# Patient Record
Sex: Female | Born: 1975 | Race: White | Hispanic: No | Marital: Married | Smoking: Never smoker
Health system: Southern US, Community
[De-identification: ages and names within clinical notes are randomized; demographics above are authoritative.]

## PROBLEM LIST (undated history)

## (undated) DIAGNOSIS — E079 Disorder of thyroid, unspecified: Secondary | ICD-10-CM

## (undated) DIAGNOSIS — R002 Palpitations: Principal | ICD-10-CM

## (undated) DIAGNOSIS — N809 Endometriosis, unspecified: Secondary | ICD-10-CM

## (undated) HISTORY — DX: Disorder of thyroid, unspecified: E07.9

## (undated) HISTORY — DX: Palpitations: R00.2

## (undated) HISTORY — DX: Endometriosis, unspecified: N80.9

## (undated) HISTORY — PX: APPENDECTOMY: SHX54

## (undated) HISTORY — PX: OTHER SURGICAL HISTORY: SHX169

---

## 2014-12-07 ENCOUNTER — Other Ambulatory Visit: Payer: Self-pay | Admitting: Obstetrics & Gynecology

## 2014-12-07 DIAGNOSIS — O200 Threatened abortion: Secondary | ICD-10-CM

## 2014-12-09 ENCOUNTER — Encounter: Payer: Self-pay | Admitting: Advanced Practice Midwife

## 2014-12-09 ENCOUNTER — Other Ambulatory Visit: Payer: Self-pay | Admitting: Obstetrics & Gynecology

## 2014-12-09 ENCOUNTER — Ambulatory Visit (INDEPENDENT_AMBULATORY_CARE_PROVIDER_SITE_OTHER): Payer: Self-pay

## 2014-12-09 ENCOUNTER — Ambulatory Visit (INDEPENDENT_AMBULATORY_CARE_PROVIDER_SITE_OTHER): Payer: Self-pay | Admitting: Advanced Practice Midwife

## 2014-12-09 VITALS — BP 120/72 | Ht 65.0 in | Wt 194.5 lb

## 2014-12-09 DIAGNOSIS — O3680X0 Pregnancy with inconclusive fetal viability, not applicable or unspecified: Secondary | ICD-10-CM

## 2014-12-09 DIAGNOSIS — O200 Threatened abortion: Secondary | ICD-10-CM

## 2014-12-09 NOTE — Progress Notes (Signed)
[facility] Clinic Visit  Patient name: Jane Benitez MRN [id number]  Date of birth: 05/28/1976  CC & HPI:  Jane Benitez is a 39 y.o. Caucasian female presenting today for ultrasound.  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  Denies pain, bleeding. Came to [facility] today for a F/U ultrasound/assess viability.   Pertinent History Reviewed:  Medical & Surgical Hx:   Past Medical History  Diagnosis Date  . Thyroid disease   . Endometriosis    Past Surgical History  Procedure Laterality Date  . Appendectomy    . Vaginal and anal repair    . Cesarean section      2012 and 2013   Family History  Problem Relation Age of Onset  . Hypertension Father   . Heart disease Maternal Grandfather   . Heart disease Paternal Grandfather     Current outpatient prescriptions:  .  levothyroxine (SYNTHROID, LEVOTHROID) 75 MCG tablet, Take 75 mcg by mouth daily before breakfast., Disp: , Rfl:  .  ondansetron (ZOFRAN) 4 MG tablet, Take 4 mg by mouth every 8 (eight) hours as needed. for nausea, Disp: , Rfl: 1 Social History: Reviewed -  reports that she has never smoked. She has never used smokeless tobacco.  Review of Systems:    Constitutional: Negative for fever, chills, weight loss, malaise/fatigue and diaphoresis.  HENT: Negative for hearing loss, ear pain, nosebleeds, congestion, sore throat, neck pain Eyes: Negative for blurred vision, double vision, photophobia, pain, discharge and redness.  Respiratory: Negative for cough, hemoptysis, sputum production, shortness of breath, wheezing.   Cardiovascular: Negative for chest pain, palpitations, leg swelling Gastrointestinal: Negative for heartburn, nausea, vomiting, diarrhea, constipation, blood in  stool Genitourinary: Negative for abdominal pain, dysuria, urgency, frequency, hematuria and flank pain.  Musculoskeletal: Negative for myalgias, back pain, joint pain and falls.  Skin: Negative for itching and rash.  Neurological: Negative for dizziness, tingling, tremors, sensory change, speech change,  weakness and headaches.     Objective Findings:  Vitals: BP 120/72 mmHg  Ht  (1.651 m)  Wt 88.225 kg (194 lb 8 oz)  BMI 32.37 kg/m2  LMP 09/13/2014 (Exact Date)  Physical Examination:  General appearance - alert, well appearing, and in no distress Mental status - normal mood, behavior, speech, dress, motor activity, and thought processes Neurological - alert, oriented, normal speech, no focal findings or movement disorder noted Musculoskeletal - no muscular tenderness noted Extremities - no pedal edema noted Skin - normal coloration and turgor, no rashes, no suspicious skin lesions noted  Ultrasound today:  Retroverted uterus with heterogeneous, mixed, cystic area within endometrium measuring 6.1 x 5.3 x 3.5cm. Concerning for potential molar pregnancy. No definitive gestational sac visualized. Bilateral ovaries are within normal limits.      Assessment & Plan:  A:   Possible molar pregnancy vs nonviable pregnancy  P:  Qhcg today.  Discussed with Dr. Despina Hidden.  Dr. Despina Hidden counseled pt regarding warning signs (hemorrhage, pain) that would warrant immediate f/u.  Otherwise, if a molar pregnancy is diagnosed, it is reasonable to postpone D&C until 3/1 when pt will have health insurance .    Request records from [place] to verify blood type (pt is B neg but  has an allel that shows as +rh; pt will need rhogam if bleeding or at time of D&C or SAB).    50% or more of this visit was spent in counseling and coordination of care.  30 minutes total encounter time    CRESENZO-DISHMAN,Ysmael Hires CNM 12/09/2014 10:42 PM

## 2014-12-09 NOTE — Patient Instructions (Signed)
Molar Pregnancy A molar pregnancy (hydatidiform mole) is a mass of tissue that grows in the uterus after conception. The mass is created by an egg that was not fertilized correctly and abnormally grows. It is an abnormal pregnancy and does not develop into a fetus. If a molar pregnancy is suspected by your health care provider, treatment is required. CAUSES  Molar pregnancy is caused by an egg that is fertilized incorrectly so that it has abnormal genetic material (chromosomes). This can result in one of 2 types of molar pregnancy:  Complete molar pregnancy--All of the chromosomes in the fertilized egg come from the father; none come from the mother.  Partial molar pregnancy--The fertilized egg has chromosomes from the father and mother, but it has too many chromosomes. RISK FACTORS  Certain risk factors make a molar pregnancy more likely. They include:   Being over age 37 or under age 37.  History of a molar pregnancy in the past (extremely small chance of recurrence). Other possible risk factors include:   Smoking more than 15 cigarettes per day.  History of infertility.  Having a certain blood type (A, B, AB).  Having a vitamin A deficiency.  Currently using oral contraceptives. SIGNS AND SYMPTOMS   Vaginal bleeding.  Missed menstrual period.  Uterus grows quicker than normal.  Severe nausea and vomiting.  Severe pressure or pain in the uterus.  Abnormal ovarian cysts (theca lutein cysts).  Discharge from the vagina that looks like grapes.  High blood pressure (early onset of preeclampsia).  Overactive thyroid (hyperthyroidism).  Anemia. DIAGNOSIS  If your health care provider thinks there is a chance of a molar pregnancy, testing will be recommended. Possible tests include:   An ultrasound test.  Blood tests. TREATMENT  Most molar pregnancies end on their own by miscarriage. However, a health care provider needs to make sure that all the abnormal tissue is out  of the womb. This can be done with dilation and curettage (D&C) or suction curettage. In this procedure, any remaining molar tissue is removed through the vagina. After diagnosis of a molar pregnancy, the pregnancy hormone levels must be followed until the level is zero. If the pregnancy hormone level does not drop appropriately, chemotherapy may be necessary. Also, you will be given a medicine called Rho (D) immune globulin if you are Rh negative and your sex partner is Rh positive. This helps prevent Rh problems in future pregnancies. HOME CARE INSTRUCTIONS   Avoid getting pregnant for 6-12 months or as directed by your health care provider. Use a reliable form of birth control or do not have sex.  Only take over-the-counter or prescription medicine as directed by your health care provider.  Keep all follow-up appointments and get all suggested lab tests and ultrasound tests.  Gradually return to normal activities.  Think about joining a support group. Ask for help if you are struggling with grief. Document Released: 07/03/2011 Document Revised: 03/01/2014 Document Reviewed: 05/14/2013 [facility] Patient Information 2015 [place], [place]. This information is not intended to replace advice given to you by your health care provider. Make sure you discuss any questions you have with your health care provider.

## 2014-12-09 NOTE — Progress Notes (Signed)
Dating US completed today. Retroverted uterus with heterogeneous, mixed, cystic area within endometrium measuring 6.1 x 5.3 x 3.5cm.  Concerning for potential molar pregnancy. No definitive gestational sac visualized.   Bilateral ovaries are within normal limits.

## 2014-12-10 ENCOUNTER — Telehealth: Payer: Self-pay | Admitting: Adult Health

## 2014-12-10 LAB — HCG, QUANTITATIVE, PREGNANCY: HCG QUANT: 36361 m[IU]/mL

## 2014-12-10 LAB — TSH: TSH: 1.87 u[IU]/mL (ref 0.450–4.500)

## 2014-12-10 NOTE — Telephone Encounter (Signed)
Pt informed of QHCG [id number] and TSH 1.870 results from 12/09/2014.

## 2014-12-14 ENCOUNTER — Telehealth: Payer: Self-pay | Admitting: Obstetrics & Gynecology

## 2014-12-14 NOTE — Telephone Encounter (Signed)
Pt states saw Drenda FreezeFran on 12/09/2014 questionable molar pregnancy. Pt states passed tissue this past Saturday, light cramping and notices clots when she voids but states she is not having a lot of bleeding. She is wearing 1 pad for all day. Pt [facility] Please advise.

## 2014-12-16 ENCOUNTER — Telehealth: Payer: Self-pay | Admitting: Obstetrics & Gynecology

## 2014-12-16 MED ORDER — MISOPROSTOL 200 MCG PO TABS: ORAL_TABLET | ORAL | Status: DC

## 2014-12-16 NOTE — Telephone Encounter (Signed)
I talked with pt by phone  Passed large mount of bloody tissue she feels is the gestational sac  Some episodic pain and bleeding now  Will try some cytotec over the next 2 days and see response  HCG 36000+  Pt given call back instructions

## 2015-07-18 LAB — OB RESULTS CONSOLE RUBELLA ANTIBODY, IGM: RUBELLA: IMMUNE

## 2015-07-18 LAB — OB RESULTS CONSOLE HIV ANTIBODY (ROUTINE TESTING): HIV: NONREACTIVE

## 2015-07-18 LAB — OB RESULTS CONSOLE RPR: RPR: NONREACTIVE

## 2015-07-18 LAB — OB RESULTS CONSOLE PLATELET COUNT: Platelets: 257 10*3/uL

## 2015-07-18 LAB — OB RESULTS CONSOLE HGB/HCT, BLOOD
HCT: 38 %
Hemoglobin: 12.6 g/dL

## 2015-07-18 LAB — OB RESULTS CONSOLE HEPATITIS B SURFACE ANTIGEN: Hepatitis B Surface Ag: NEGATIVE

## 2015-10-14 ENCOUNTER — Encounter (HOSPITAL_COMMUNITY): Payer: Self-pay | Admitting: *Deleted

## 2015-11-04 LAB — OB RESULTS CONSOLE HGB/HCT, BLOOD
HCT: 34 %
Hemoglobin: 11.6 g/dL

## 2015-11-22 ENCOUNTER — Telehealth: Payer: Self-pay | Admitting: Cardiovascular Disease

## 2015-11-22 NOTE — Telephone Encounter (Signed)
Received records from [facility] for appointment on 12/19/15 with Dr Duke Salvia.  Records given to [facility] (medical records) for Dr Leonides Sake schedule on 12/19/15. lp

## 2015-11-23 ENCOUNTER — Other Ambulatory Visit: Payer: Self-pay | Admitting: Obstetrics and Gynecology

## 2015-12-07 ENCOUNTER — Encounter (HOSPITAL_COMMUNITY): Payer: Self-pay | Admitting: Obstetrics and Gynecology

## 2015-12-16 ENCOUNTER — Encounter (HOSPITAL_COMMUNITY): Payer: Self-pay

## 2015-12-16 ENCOUNTER — Ambulatory Visit (HOSPITAL_COMMUNITY)
Admission: RE | Admit: 2015-12-16 | Discharge: 2015-12-16 | Disposition: A | Discharge status: Home / Self Care | Payer: BLUE CROSS/BLUE SHIELD | Source: Ambulatory Visit | Attending: Obstetrics and Gynecology | Admitting: Obstetrics and Gynecology

## 2015-12-16 DIAGNOSIS — O34219 Maternal care for unspecified type scar from previous cesarean delivery: Secondary | ICD-10-CM | POA: Insufficient documentation

## 2015-12-16 DIAGNOSIS — Z3A32 32 weeks gestation of pregnancy: Secondary | ICD-10-CM | POA: Insufficient documentation

## 2015-12-16 NOTE — Consult Note (Signed)
Maternal Fetal Medicine Consultation  Requesting Provider(s): Waynard Reeds, MD  Reason for consultation: suspected uterine scar defect - timing of delivery  HPI: Jane Benitez is a 40 yo G5P2102, EDD 32w 4d seen for consultation and recommendations for timing of delivery due to a suspected uterine scar abnormality.  Jane Benitez past OB history is as follows: G1 - 2009 - preterm contractions, SROM, SVD at 39 weeks complicated by 4th degree laceration.  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  G2 - C-section at 36 6/7 weeks - active labor.  No complications  G3 - Repeat C-section 37 weeks (presented in active labor).  Operative notes for both C-sections not available.  G4 - SAB at 14 weeks.  Concerns for uterine scar defect noted on prenatal ultrasound.  Did not require D&C.  Due to concerns of a uterine scar defect on prenatal ultrasound, a sonohystogram was performed in the office.  A uterine scar defect was noted - 2.3 mm (while not pregnant).  Jane Benitez was seen today for recommendations for timing of delivery given concerns for a uterine scar defect by ultrasound. Her primary Obstetrician feels very strongly that the C-section should be scheduled prior to 39 weeks. The patient's biggest concern is actually that she will go into preterm labor prior to 39 weeks and wants to avoid a vaginal delivery at all costs given the extensive pelvic surgery repairs that she has had in the past.  OB History: OB History    Gravida Para Term Preterm AB TAB SAB Ectopic Multiple Living   2               PMH:  Past Medical History  Diagnosis Date  . Thyroid disease   . Endometriosis     PSH:  Past Surgical History  Procedure Laterality Date  . Appendectomy    . Vaginal and anal repair    . Cesarean section      2012 and 2013   Meds:  Current Outpatient Prescriptions on File Prior to Encounter  Medication Sig Dispense Refill  . levothyroxine (SYNTHROID,  LEVOTHROID) 75 MCG tablet Take 75 mcg by mouth daily before breakfast.    . misoprostol (CYTOTEC) 200 MCG tablet 1 tablet 4 times a day for 2 days 8 tablet 0  . ondansetron (ZOFRAN) 4 MG tablet Take 4 mg by mouth every 8 (eight) hours as needed. for nausea  1   No current facility-administered medications on file prior to encounter.   Allergies: No Known Allergies   FH:  Family History  Problem Relation Age of Onset  . Hypertension Father   . Heart disease Maternal Grandfather   . Heart disease Paternal Grandfather    Soc:  Social History   Social History  . Marital Status: Married    Spouse Name: N/A  . Number of Children: N/A  . Years of Education: N/A   Occupational History  . Not on file.   Social History Main Topics  . Smoking status: Never Smoker   . Smokeless tobacco: Never Used  . Alcohol Use: No  . Drug Use: No  . Sexual Activity: Yes   Other Topics Concern  . Not on file   Social History Narrative    Review of Systems: no vaginal bleeding or cramping/contractions, no LOF, no nausea/vomiting. All other systems reviewed and are negative.  PE:   Filed Vitals:   12/16/15 1113  BP: 134/71  Pulse: 89     A/P: 1)  Single IUP at 32w 4d  2) Hx of previous 4th degree laceration, RV fistula requiring repair x 2  3) Hx of C-section x 2 with concerns for uterine scar abnormality - the patient's previous C-sections were both performed in [place] and operative notes are not available for review.  A sonohystogram was performed prior to pregnancy that revealed a uterine scar defect - measuring 2.3 mm.  There is very little guidance regarding the appropriate timing for repeat C-section in women with suspected uterine scar defects.  Based on a quick literature review, a lower uterine segment thickness of < 3.65 mm has been used in counseling patient for VBAC (less than that value should not labor).  At least one study from 2009 reported that a full thickness LUG of < 2.3 mm was  associated with a higher risk of uterine rupture.  Both of these studies used measurements performed during the 3rd trimester, but if this degree of thinning was noted on a pre-pregnancy study, I would expect the thinning to be even more significant now.  I cannot predict the risk of uterine rupture should the patient labor or if her repeat C-section is scheduled at 39 weeks as opposed to 37 weeks.  She is clearly not a candidate for a trial of labor.  Her primary Obstetrician feels very strongly that delivery at 37 weeks is indicated. Unfortunately, there are no published guidelines to assist in  the timing of delivery, but would have to assume that the risk of uterine rupture would approach that of a prior vertical uterine incision.  In my opinion, feel that a scheduled C-section is appropriate at 37 weeks.  Would offer a course of late preterm betamethasone at ~36 weeks.  I would not perform amniocentesis to verify fetal lung maturity as the C-section is will be performed for maternal indications.   Thank you for the opportunity to be a part of the care of Jane Benitez. Please contact our office if we can be of further assistance.   I spent approximately 30 minutes with this patient with over 50% of time spent in face-to-face counseling.  Alpha Gula, MD Maternal Fetal Medicine

## 2015-12-18 NOTE — Progress Notes (Signed)
Cardiology Office Note   Date:  12/19/2015   ID:  Jane Benitez, DOB 1976-07-08, MRN [id number]  PCP:  No primary care provider on file.  Cardiologist:   Madilyn Hook, MD   Chief Complaint  Patient presents with  . New Evaluation    feels like heart is skipping a beat and having tachycardia    History of Present Illness: Jane Benitez is a 40 y.o. pregnant female with GERD who presents for an evaluation of palpitations.  Jane Benitez is [redacted] weeks pregnant.  During this pregnancy she has noted episodes of both intermittent and longer lasting palpitations.  She describes the intermittent palpitations as heart fluttering that lasts for 2-3 seconds at a time.  It is not associated with lightheadedness, dizziness, chest pain or shortness of breath.  It occurs sporadically both at rest and with exertion and happens most days.  She also reports heart racing that last for 2-3 minutes at a time.  She counted her heart rate and it was 150 bpm.  This was associated with nausea and a feeling of fatigue.  This occurs randomly and ranges from once every 1-2 weeks up to 2-3 times in one week.  She never had these symptoms before becoming pregnant or with her 5 previous pregnancies.  She has not noted any more lower extremity edema or shortness of breath with this pregnancy than with any of her previous pregnancies.  She tried limiting caffeine intake, but this did not change her symptoms.    Jane Benitez reported these symptoms to her OB/Gyn, Dr. Waynard Reeds.  Her thyroid function was within normal limits and she was mildly anemic.  She was referred to cardiology for further evaluation.  Past Medical History  Diagnosis Date  . Thyroid disease   . Endometriosis   . Palpitations 12/19/2015    Past Surgical History  Procedure Laterality Date  . Appendectomy    . Vaginal and anal repair    . Cesarean section      2012 and 2013     Current Outpatient Prescriptions  Medication Sig  Dispense Refill  . lansoprazole (PREVACID) 30 MG capsule Take 30 mg by mouth as needed.    Marland Kitchen levothyroxine (SYNTHROID, LEVOTHROID) 75 MCG tablet Take 75 mcg by mouth daily before breakfast.    . Prenatal Vit w/Fe-Methylfol-FA (PNV PO) Take by mouth.     No current facility-administered medications for this visit.    Allergies:   Review of patient's allergies indicates no known allergies.    Social History:  The patient  reports that she has never smoked. She has never used smokeless tobacco. She reports that she does not drink alcohol or use illicit drugs.   Family History:  The patient's family history includes Heart disease in her maternal grandfather and paternal grandfather; Hypertension in her father, maternal grandfather, and paternal grandfather.  Dad, PGF, MGF in 23s, 70s  ROS:  Please see the history of present illness.   Otherwise, review of systems are positive for none.   All other systems are reviewed and negative.    PHYSICAL EXAM: VS:  BP 130/84 mmHg  Pulse 72  Ht  (1.651 m)  Wt 95.255 kg (210 lb)  BMI 34.95 kg/m2  LMP 05/02/2015 , BMI Body mass index is 34.95 kg/(m^2). GENERAL:  Well appearing HEENT:  Pupils equal round and reactive, fundi not visualized, oral mucosa unremarkable NECK:  No jugular venous distention, waveform within normal limits, carotid upstroke brisk and  symmetric, no bruits, no thyromegaly LYMPHATICS:  No cervical adenopathy LUNGS:  Clear to auscultation bilaterally HEART:  RRR.  PMI not displaced or sustained,S1 and S2 within normal limits, no S3, no S4, no clicks, no rubs, II/VI murmur heard throughout. ABD:  Flat, positive bowel sounds normal in frequency in pitch, no bruits, no rebound, no guarding, no midline pulsatile mass, no hepatomegaly, no splenomegaly EXT:  2 plus pulses throughout, no edema, no cyanosis no clubbing SKIN:  No rashes no nodules NEURO:  Cranial nerves II through XII grossly intact, motor grossly intact  throughout PSYCH:  Cognitively intact, oriented to person place and time   EKG:  EKG is ordered today. The ekg ordered today demonstrates sinus rhythm rate 82 bpm.     Recent Labs: No results found for requested labs within last 365 days.    Lipid Panel No results found for: CHOL, TRIG, HDL, CHOLHDL, VLDL, LDLCALC, LDLDIRECT    Wt Readings from Last 3 Encounters:  12/19/15 95.255 kg (210 lb)  12/16/15 96.163 kg (212 lb)  12/09/14 88.225 kg (194 lb 8 oz)    11/04/15: TSH 1.81 WBC 10.5, Hgb 11.6, Hct 33.9, Plt 258   ASSESSMENT AND PLAN:  # Palpitations: I'm suspicious that she is having PACs or PVCs that are causing her intermittent palpitations.  However, the longer episodes sound as though she is having SVT.  This can sometimes be exacerbated by pregnancy.  We will check a BMP and magnesium.  We will also get a 14 day Event monitor to associate the symptoms with the underlying rhythm.  We will not start any medications at this time.  She does not seem to be very bothered by the symptoms and would like to avoid medications if possible.  We will follow up after pregnancy to see if her symptoms have resolved.   Current medicines are reviewed at length with the patient today.  The patient does not have concerns regarding medicines.  The following changes have been made:  no change  Labs/ tests ordered today include:   Orders Placed This Encounter  Procedures  . Basic metabolic panel  . Magnesium  . Cardiac event monitor  . EKG 12-Lead     Disposition:   FU with Moneka Mcquinn C. Duke Salvia, MD, [facility] in 3 months.    This note was written with the assistance of speech recognition software.  Please excuse any transcriptional errors.  Signed, Avonda Toso C. Duke Salvia, MD, [facility]  12/19/2015 12:55 PM    [facility]

## 2015-12-19 ENCOUNTER — Encounter: Payer: Self-pay | Admitting: Cardiovascular Disease

## 2015-12-19 ENCOUNTER — Ambulatory Visit (INDEPENDENT_AMBULATORY_CARE_PROVIDER_SITE_OTHER): Payer: BLUE CROSS/BLUE SHIELD | Admitting: Cardiovascular Disease

## 2015-12-19 VITALS — BP 130/84 | HR 72 | Ht 65.0 in | Wt 210.0 lb

## 2015-12-19 DIAGNOSIS — R002 Palpitations: Secondary | ICD-10-CM

## 2015-12-19 HISTORY — DX: Palpitations: R00.2

## 2015-12-19 NOTE — Patient Instructions (Signed)
PLEASE HAVE LAB DONE --BMP ,MAGNESIUM  Your physician has recommended that you wear an event monitor-14 DAY FOR PALPITATIONS -WILL BE PLACED AT [address]. Event monitors are medical devices that record the heart's electrical activity. Doctors most often [place] these monitors to diagnose arrhythmias. Arrhythmias are problems with the speed or rhythm of the heartbeat. The monitor is a small, portable device. You can wear one while you do your normal daily activities. This is usually used to diagnose what is causing palpitations/syncope (passing out).   WILL CALL YOU WITH RESULTS.   Your physician recommends that you schedule a follow-up appointment in 3 MONTHS WITH DR [facility].

## 2015-12-29 ENCOUNTER — Other Ambulatory Visit: Payer: Self-pay | Admitting: Obstetrics and Gynecology

## 2015-12-30 ENCOUNTER — Inpatient Hospital Stay (HOSPITAL_COMMUNITY): Payer: BLUE CROSS/BLUE SHIELD

## 2015-12-30 ENCOUNTER — Encounter (HOSPITAL_COMMUNITY): Payer: Self-pay

## 2015-12-30 ENCOUNTER — Inpatient Hospital Stay (HOSPITAL_COMMUNITY)
Admission: AD | Admit: 2015-12-30 | Discharge: 2016-01-03 | DRG: 765 | Disposition: A | Discharge status: Home / Self Care | Payer: BLUE CROSS/BLUE SHIELD | Source: Ambulatory Visit | Attending: Obstetrics | Admitting: Obstetrics

## 2015-12-30 ENCOUNTER — Observation Stay (HOSPITAL_COMMUNITY): Payer: BLUE CROSS/BLUE SHIELD | Admitting: Anesthesiology

## 2015-12-30 ENCOUNTER — Encounter (HOSPITAL_COMMUNITY)
Admission: AD | Disposition: A | Discharge status: Home / Self Care | Payer: Self-pay | Source: Ambulatory Visit | Attending: Obstetrics

## 2015-12-30 DIAGNOSIS — O368390 Maternal care for abnormalities of the fetal heart rate or rhythm, unspecified trimester, not applicable or unspecified: Secondary | ICD-10-CM

## 2015-12-30 DIAGNOSIS — Z1389 Encounter for screening for other disorder: Secondary | ICD-10-CM

## 2015-12-30 DIAGNOSIS — O34211 Maternal care for low transverse scar from previous cesarean delivery: Principal | ICD-10-CM | POA: Diagnosis present

## 2015-12-30 DIAGNOSIS — Z363 Encounter for antenatal screening for malformations: Secondary | ICD-10-CM

## 2015-12-30 DIAGNOSIS — Z302 Encounter for sterilization: Secondary | ICD-10-CM

## 2015-12-30 DIAGNOSIS — Z98891 History of uterine scar from previous surgery: Secondary | ICD-10-CM

## 2015-12-30 DIAGNOSIS — Z3A34 34 weeks gestation of pregnancy: Secondary | ICD-10-CM | POA: Diagnosis not present

## 2015-12-30 DIAGNOSIS — O4700 False labor before 37 completed weeks of gestation, unspecified trimester: Secondary | ICD-10-CM | POA: Diagnosis present

## 2015-12-30 DIAGNOSIS — IMO0002 Reserved for concepts with insufficient information to code with codable children: Secondary | ICD-10-CM

## 2015-12-30 LAB — URINE MICROSCOPIC-ADD ON

## 2015-12-30 LAB — TYPE AND SCREEN
ABO/RH(D): B NEG
Antibody Screen: NEGATIVE

## 2015-12-30 LAB — URINALYSIS, ROUTINE W REFLEX MICROSCOPIC
BILIRUBIN URINE: NEGATIVE
Glucose, UA: NEGATIVE mg/dL
Ketones, ur: NEGATIVE mg/dL
LEUKOCYTES UA: NEGATIVE
NITRITE: NEGATIVE
Protein, ur: NEGATIVE mg/dL
pH: 7 (ref 5.0–8.0)

## 2015-12-30 LAB — CBC
HCT: 31.7 % — ABNORMAL LOW (ref 36.0–46.0)
Hemoglobin: 10.8 g/dL — ABNORMAL LOW (ref 12.0–15.0)
MCH: 28.7 pg (ref 26.0–34.0)
MCHC: 34.1 g/dL (ref 30.0–36.0)
MCV: 84.3 fL (ref 78.0–100.0)
PLATELETS: 218 10*3/uL (ref 150–400)
RBC: 3.76 MIL/uL — AB (ref 3.87–5.11)
RDW: 12.8 % (ref 11.5–15.5)
WBC: 13.9 10*3/uL — AB (ref 4.0–10.5)

## 2015-12-30 LAB — ABO/RH: ABO/RH(D): B NEG

## 2015-12-30 LAB — GROUP B STREP BY PCR: GROUP B STREP BY PCR: NEGATIVE

## 2015-12-30 SURGERY — Surgical Case
Anesthesia: Spinal

## 2015-12-30 MED ORDER — NALBUPHINE HCL 10 MG/ML IJ SOLN: 5.0000 mg | INTRAMUSCULAR | Status: DC | PRN

## 2015-12-30 MED ORDER — DIPHENHYDRAMINE HCL 25 MG PO CAPS
25.0000 mg | ORAL_CAPSULE | Freq: Four times a day (QID) | ORAL | Status: DC | PRN
Start: 2015-12-30 — End: 2016-01-03
  Filled 2015-12-30 (×2): qty 1

## 2015-12-30 MED ORDER — ACETAMINOPHEN 325 MG PO TABS: 650.0000 mg | ORAL_TABLET | ORAL | Status: DC | PRN

## 2015-12-30 MED ORDER — ONDANSETRON HCL 4 MG/2ML IJ SOLN: 4.0000 mg | Freq: Three times a day (TID) | INTRAMUSCULAR | Status: DC | PRN

## 2015-12-30 MED ORDER — MORPHINE SULFATE (PF) 0.5 MG/ML IJ SOLN
INTRAMUSCULAR | Status: AC
  Filled 2015-12-30: qty 10

## 2015-12-30 MED ORDER — NALOXONE HCL 0.4 MG/ML IJ SOLN: 0.4000 mg | INTRAMUSCULAR | Status: DC | PRN

## 2015-12-30 MED ORDER — DIBUCAINE 1 % RE OINT: 1.0000 "application " | TOPICAL_OINTMENT | RECTAL | Status: DC | PRN

## 2015-12-30 MED ORDER — BUPIVACAINE IN DEXTROSE 0.75-8.25 % IT SOLN
INTRATHECAL | Status: DC | PRN
  Administered 2015-12-30: 1.3 mL via INTRATHECAL

## 2015-12-30 MED ORDER — SCOPOLAMINE 1 MG/3DAYS TD PT72: 1.0000 | MEDICATED_PATCH | Freq: Once | TRANSDERMAL | Status: DC

## 2015-12-30 MED ORDER — CEFAZOLIN SODIUM-DEXTROSE 2-3 GM-% IV SOLR
2.0000 g | Freq: Once | INTRAVENOUS | Status: AC
  Administered 2015-12-30: 2 g via INTRAVENOUS
  Filled 2015-12-30: qty 50

## 2015-12-30 MED ORDER — PRENATAL MULTIVITAMIN CH
1.0000 | ORAL_TABLET | Freq: Every day | ORAL | Status: DC
  Administered 2015-12-31 – 2016-01-02 (×3): 1 via ORAL
  Filled 2015-12-30 (×3): qty 1

## 2015-12-30 MED ORDER — SCOPOLAMINE 1 MG/3DAYS TD PT72
MEDICATED_PATCH | TRANSDERMAL | Status: DC | PRN
  Administered 2015-12-30: 1 via TRANSDERMAL

## 2015-12-30 MED ORDER — TETANUS-DIPHTH-ACELL PERTUSSIS 5-2.5-18.5 LF-MCG/0.5 IM SUSP: 0.5000 mL | Freq: Once | INTRAMUSCULAR | Status: DC

## 2015-12-30 MED ORDER — PHENYLEPHRINE 8 MG IN D5W 100 ML (0.08MG/ML) PREMIX OPTIME
INJECTION | INTRAVENOUS | Status: AC
  Filled 2015-12-30: qty 100

## 2015-12-30 MED ORDER — NALOXONE HCL 2 MG/2ML IJ SOSY: 1.0000 ug/kg/h | PREFILLED_SYRINGE | INTRAVENOUS | Status: DC | PRN

## 2015-12-30 MED ORDER — CEFAZOLIN (ANCEF) 1 G IV SOLR: 1.0000 g | INTRAVENOUS | Status: DC

## 2015-12-30 MED ORDER — FENTANYL CITRATE (PF) 100 MCG/2ML IJ SOLN
INTRAMUSCULAR | Status: AC
  Filled 2015-12-30: qty 2

## 2015-12-30 MED ORDER — LACTATED RINGERS IV SOLN
INTRAVENOUS | Status: DC | PRN
  Administered 2015-12-30 (×3): via INTRAVENOUS

## 2015-12-30 MED ORDER — PRENATAL MULTIVITAMIN CH
1.0000 | ORAL_TABLET | Freq: Every day | ORAL | Status: DC
  Filled 2015-12-30 (×2): qty 1

## 2015-12-30 MED ORDER — SODIUM CHLORIDE 0.9 % IR SOLN
Status: DC | PRN
  Administered 2015-12-30: 1

## 2015-12-30 MED ORDER — SIMETHICONE 80 MG PO CHEW: 80.0000 mg | CHEWABLE_TABLET | ORAL | Status: DC | PRN

## 2015-12-30 MED ORDER — LEVOTHYROXINE SODIUM 75 MCG PO TABS
75.0000 ug | ORAL_TABLET | Freq: Every day | ORAL | Status: DC
  Administered 2015-12-31 – 2016-01-03 (×4): 75 ug via ORAL
  Filled 2015-12-30 (×4): qty 1

## 2015-12-30 MED ORDER — MEPERIDINE HCL 25 MG/ML IJ SOLN: 6.2500 mg | INTRAMUSCULAR | Status: DC | PRN

## 2015-12-30 MED ORDER — SODIUM CHLORIDE 0.9% FLUSH: 3.0000 mL | INTRAVENOUS | Status: DC | PRN

## 2015-12-30 MED ORDER — KETOROLAC TROMETHAMINE 30 MG/ML IJ SOLN: 30.0000 mg | Freq: Four times a day (QID) | INTRAMUSCULAR | Status: AC | PRN

## 2015-12-30 MED ORDER — CALCIUM CARBONATE ANTACID 500 MG PO CHEW
2.0000 | CHEWABLE_TABLET | ORAL | Status: DC | PRN
  Filled 2015-12-30: qty 2

## 2015-12-30 MED ORDER — DOCUSATE SODIUM 100 MG PO CAPS
100.0000 mg | ORAL_CAPSULE | Freq: Every day | ORAL | Status: DC
  Filled 2015-12-30 (×2): qty 1

## 2015-12-30 MED ORDER — OXYCODONE HCL 5 MG PO TABS
10.0000 mg | ORAL_TABLET | ORAL | Status: DC | PRN
  Administered 2016-01-01 (×2): 10 mg via ORAL
  Filled 2015-12-30 (×2): qty 2

## 2015-12-30 MED ORDER — LACTATED RINGERS IV SOLN: INTRAVENOUS | Status: DC

## 2015-12-30 MED ORDER — OXYTOCIN 10 UNIT/ML IJ SOLN
INTRAMUSCULAR | Status: AC
  Filled 2015-12-30: qty 4

## 2015-12-30 MED ORDER — MORPHINE SULFATE (PF) 4 MG/ML IV SOLN
8.0000 mg | Freq: Once | INTRAVENOUS | Status: AC
  Administered 2015-12-30: 8 mg via INTRAMUSCULAR
  Filled 2015-12-30: qty 2

## 2015-12-30 MED ORDER — ONDANSETRON HCL 4 MG/2ML IJ SOLN
INTRAMUSCULAR | Status: DC | PRN
  Administered 2015-12-30: 4 mg via INTRAVENOUS

## 2015-12-30 MED ORDER — KETOROLAC TROMETHAMINE 30 MG/ML IJ SOLN
30.0000 mg | Freq: Four times a day (QID) | INTRAMUSCULAR | Status: AC | PRN
  Administered 2015-12-30: 30 mg via INTRAMUSCULAR

## 2015-12-30 MED ORDER — DIPHENHYDRAMINE HCL 25 MG PO CAPS
25.0000 mg | ORAL_CAPSULE | ORAL | Status: DC | PRN
  Administered 2015-12-30 – 2015-12-31 (×2): 25 mg via ORAL
  Filled 2015-12-30: qty 1

## 2015-12-30 MED ORDER — SCOPOLAMINE 1 MG/3DAYS TD PT72
MEDICATED_PATCH | TRANSDERMAL | Status: AC
  Filled 2015-12-30: qty 1

## 2015-12-30 MED ORDER — DEXAMETHASONE SODIUM PHOSPHATE 4 MG/ML IJ SOLN
INTRAMUSCULAR | Status: AC
  Filled 2015-12-30: qty 1

## 2015-12-30 MED ORDER — SIMETHICONE 80 MG PO CHEW
80.0000 mg | CHEWABLE_TABLET | ORAL | Status: DC
  Administered 2015-12-30 – 2016-01-02 (×4): 80 mg via ORAL
  Filled 2015-12-30 (×4): qty 1

## 2015-12-30 MED ORDER — OXYTOCIN 10 UNIT/ML IJ SOLN
40.0000 [IU] | INTRAVENOUS | Status: DC | PRN
  Administered 2015-12-30: 40 [IU] via INTRAVENOUS

## 2015-12-30 MED ORDER — CITRIC ACID-SODIUM CITRATE 334-500 MG/5ML PO SOLN
ORAL | Status: AC
Start: 2015-12-30 — End: 2015-12-30
  Administered 2015-12-30: 30 mL
  Filled 2015-12-30: qty 15

## 2015-12-30 MED ORDER — MORPHINE SULFATE (PF) 4 MG/ML IV SOLN
2.0000 mg | Freq: Once | INTRAVENOUS | Status: AC
  Administered 2015-12-30: 2 mg via INTRAVENOUS
  Filled 2015-12-30: qty 1

## 2015-12-30 MED ORDER — DEXAMETHASONE SODIUM PHOSPHATE 4 MG/ML IJ SOLN
INTRAMUSCULAR | Status: DC | PRN
  Administered 2015-12-30: 4 mg via INTRAVENOUS

## 2015-12-30 MED ORDER — MORPHINE SULFATE (PF) 0.5 MG/ML IJ SOLN
INTRAMUSCULAR | Status: DC | PRN
  Administered 2015-12-30: .1 mg via INTRATHECAL

## 2015-12-30 MED ORDER — NALBUPHINE HCL 10 MG/ML IJ SOLN: 5.0000 mg | Freq: Once | INTRAMUSCULAR | Status: DC | PRN

## 2015-12-30 MED ORDER — WITCH HAZEL-GLYCERIN EX PADS: 1.0000 "application " | MEDICATED_PAD | CUTANEOUS | Status: DC | PRN

## 2015-12-30 MED ORDER — IBUPROFEN 600 MG PO TABS
600.0000 mg | ORAL_TABLET | Freq: Four times a day (QID) | ORAL | Status: DC
  Administered 2015-12-30 – 2016-01-03 (×13): 600 mg via ORAL
  Filled 2015-12-30 (×14): qty 1

## 2015-12-30 MED ORDER — OXYCODONE HCL 5 MG PO TABS
5.0000 mg | ORAL_TABLET | ORAL | Status: DC | PRN
  Administered 2015-12-31 – 2016-01-02 (×5): 5 mg via ORAL
  Filled 2015-12-30 (×5): qty 1

## 2015-12-30 MED ORDER — HYDROMORPHONE HCL 1 MG/ML IJ SOLN
INTRAMUSCULAR | Status: AC
  Filled 2015-12-30: qty 1

## 2015-12-30 MED ORDER — ONDANSETRON HCL 4 MG/2ML IJ SOLN
INTRAMUSCULAR | Status: AC
  Filled 2015-12-30: qty 2

## 2015-12-30 MED ORDER — SENNOSIDES-DOCUSATE SODIUM 8.6-50 MG PO TABS
2.0000 | ORAL_TABLET | ORAL | Status: DC
  Administered 2015-12-30 – 2016-01-02 (×4): 2 via ORAL
  Filled 2015-12-30 (×4): qty 2

## 2015-12-30 MED ORDER — KETOROLAC TROMETHAMINE 30 MG/ML IJ SOLN
INTRAMUSCULAR | Status: AC
Start: 2015-12-30 — End: 2015-12-30
  Administered 2015-12-30: 30 mg via INTRAMUSCULAR
  Filled 2015-12-30: qty 1

## 2015-12-30 MED ORDER — LACTATED RINGERS IV SOLN: 2.5000 [IU]/h | INTRAVENOUS | Status: AC

## 2015-12-30 MED ORDER — PHENYLEPHRINE 8 MG IN D5W 100 ML (0.08MG/ML) PREMIX OPTIME
INJECTION | INTRAVENOUS | Status: DC | PRN
  Administered 2015-12-30: 60 ug/min via INTRAVENOUS

## 2015-12-30 MED ORDER — FENTANYL CITRATE (PF) 100 MCG/2ML IJ SOLN: 25.0000 ug | INTRAMUSCULAR | Status: DC | PRN

## 2015-12-30 MED ORDER — ZOLPIDEM TARTRATE 5 MG PO TABS: 5.0000 mg | ORAL_TABLET | Freq: Every evening | ORAL | Status: DC | PRN

## 2015-12-30 MED ORDER — DIPHENHYDRAMINE HCL 50 MG/ML IJ SOLN: 12.5000 mg | INTRAMUSCULAR | Status: DC | PRN

## 2015-12-30 MED ORDER — CEFAZOLIN SODIUM-DEXTROSE 2-3 GM-% IV SOLR
INTRAVENOUS | Status: AC
  Filled 2015-12-30: qty 50

## 2015-12-30 MED ORDER — LANOLIN HYDROUS EX OINT
1.0000 | TOPICAL_OINTMENT | CUTANEOUS | Status: DC | PRN
Start: 2015-12-30 — End: 2016-01-03

## 2015-12-30 MED ORDER — HYDROMORPHONE HCL 1 MG/ML IJ SOLN
INTRAMUSCULAR | Status: DC | PRN
  Administered 2015-12-30: 1 mg via INTRAVENOUS

## 2015-12-30 MED ORDER — SIMETHICONE 80 MG PO CHEW
80.0000 mg | CHEWABLE_TABLET | Freq: Three times a day (TID) | ORAL | Status: DC
  Administered 2015-12-31 – 2016-01-01 (×5): 80 mg via ORAL
  Filled 2015-12-30 (×6): qty 1

## 2015-12-30 MED ORDER — NIFEDIPINE 10 MG PO CAPS
10.0000 mg | ORAL_CAPSULE | ORAL | Status: AC | PRN
  Administered 2015-12-30 (×3): 10 mg via ORAL
  Filled 2015-12-30 (×3): qty 1

## 2015-12-30 MED ORDER — MENTHOL 3 MG MT LOZG: 1.0000 | LOZENGE | OROMUCOSAL | Status: DC | PRN

## 2015-12-30 MED ORDER — LACTATED RINGERS IV BOLUS (SEPSIS)
1000.0000 mL | Freq: Once | INTRAVENOUS | Status: AC
  Administered 2015-12-30: 1000 mL via INTRAVENOUS

## 2015-12-30 MED ORDER — MORPHINE SULFATE (PF) 4 MG/ML IV SOLN
4.0000 mg | Freq: Once | INTRAVENOUS | Status: AC
  Administered 2015-12-30: 4 mg via INTRAVENOUS
  Filled 2015-12-30: qty 1

## 2015-12-30 MED ORDER — BETAMETHASONE SOD PHOS & ACET 6 (3-3) MG/ML IJ SUSP
12.0000 mg | Freq: Once | INTRAMUSCULAR | Status: AC
  Administered 2015-12-30: 12 mg via INTRAMUSCULAR
  Filled 2015-12-30: qty 2

## 2015-12-30 MED ORDER — FENTANYL CITRATE (PF) 100 MCG/2ML IJ SOLN
INTRAMUSCULAR | Status: DC | PRN
  Administered 2015-12-30: 50 ug via INTRAVENOUS
  Administered 2015-12-30: 12.5 ug via INTRATHECAL
  Administered 2015-12-30: 50 ug via INTRAVENOUS

## 2015-12-30 SURGICAL SUPPLY — 37 items
BENZOIN TINCTURE PRP APPL 2/3 (GAUZE/BANDAGES/DRESSINGS) ×3 IMPLANT
CLAMP CORD UMBIL (MISCELLANEOUS) IMPLANT
CLOSURE WOUND 1/2 X4 (GAUZE/BANDAGES/DRESSINGS) ×1
CLOTH BEACON ORANGE TIMEOUT ST (SAFETY) ×3 IMPLANT
DRSG OPSITE POSTOP 4X10 (GAUZE/BANDAGES/DRESSINGS) ×3 IMPLANT
DURAPREP 26ML APPLICATOR (WOUND CARE) ×3 IMPLANT
ELECT REM PT RETURN 9FT ADLT (ELECTROSURGICAL) ×3
ELECTRODE REM PT RTRN 9FT ADLT (ELECTROSURGICAL) ×1 IMPLANT
EXTRACTOR VACUUM KIWI (MISCELLANEOUS) IMPLANT
GLOVE BIO SURGEON STRL SZ 6 (GLOVE) ×3 IMPLANT
GLOVE BIOGEL PI IND STRL 6.5 (GLOVE) ×1 IMPLANT
GLOVE BIOGEL PI IND STRL 7.0 (GLOVE) ×1 IMPLANT
GLOVE BIOGEL PI INDICATOR 6.5 (GLOVE) ×2
GLOVE BIOGEL PI INDICATOR 7.0 (GLOVE) ×2
GOWN STRL REUS W/TWL LRG LVL3 (GOWN DISPOSABLE) ×6 IMPLANT
KIT ABG SYR 3ML LUER SLIP (SYRINGE) IMPLANT
NEEDLE HYPO 25X5/8 SAFETYGLIDE (NEEDLE) IMPLANT
NS IRRIG 1000ML POUR BTL (IV SOLUTION) ×3 IMPLANT
PACK C SECTION WH (CUSTOM PROCEDURE TRAY) ×3 IMPLANT
PAD ABD 7.5X8 STRL (GAUZE/BANDAGES/DRESSINGS) ×3 IMPLANT
PAD OB MATERNITY 4.3X12.25 (PERSONAL CARE ITEMS) ×3 IMPLANT
PENCIL SMOKE EVAC W/HOLSTER (ELECTROSURGICAL) ×3 IMPLANT
RTRCTR C-SECT PINK 25CM LRG (MISCELLANEOUS) ×6 IMPLANT
SPONGE GAUZE 4X4 12PLY STER LF (GAUZE/BANDAGES/DRESSINGS) ×6 IMPLANT
STRIP CLOSURE SKIN 1/2X4 (GAUZE/BANDAGES/DRESSINGS) ×2 IMPLANT
SUT MNCRL 0 VIOLET CTX 36 (SUTURE) ×2 IMPLANT
SUT MNCRL AB 3-0 PS2 27 (SUTURE) ×3 IMPLANT
SUT MONOCRYL 0 CTX 36 (SUTURE) ×4
SUT PLAIN 0 NONE (SUTURE) ×3 IMPLANT
SUT PLAIN 2 0 (SUTURE) ×2
SUT PLAIN ABS 2-0 CT1 27XMFL (SUTURE) ×1 IMPLANT
SUT VIC AB 0 CTX 36 (SUTURE) ×4
SUT VIC AB 0 CTX36XBRD ANBCTRL (SUTURE) ×2 IMPLANT
SUT VIC AB 2-0 CT1 27 (SUTURE) ×2
SUT VIC AB 2-0 CT1 TAPERPNT 27 (SUTURE) ×1 IMPLANT
TOWEL OR 17X24 6PK STRL BLUE (TOWEL DISPOSABLE) ×3 IMPLANT
TRAY FOLEY CATH SILVER 14FR (SET/KITS/TRAYS/PACK) ×3 IMPLANT

## 2015-12-30 NOTE — Progress Notes (Signed)
Patient seen and examined.  Pain not improved with 4mg  IV morphine 1 hr ago.  Reports continued pain that is now more concentrated at the level of prior incision.   Uncomfortable with contractions Toco: q3-5 minutes EFM: 130s, mod var, Cat 1 SVE: 2-3/80/-2, bloody show with exam  Given history of prior cesarean section x 2 and cervical change, will proceed to the OR at this time for repeat cesarean section and bilateral tubal ligation.  We discussed risks of infection, bleeding, damage to surrounding structures, including but not limited to, bowel, bladder, tubes, ovaries, nerves vessels, risks of vte, need for blood transfusion, need for additional surgical procedure.  We discussed risk of regret with btl and risk of failure.  Consent signed.       Preop hgb 10.8, T&S active

## 2015-12-30 NOTE — MAU Provider Note (Signed)
History     CSN: [id number]  Arrival date and time: 12/30/15 [id number]   First Provider Initiated Contact with Patient 12/30/15 0831      Chief Complaint  Patient presents with  . Contractions  . Vaginal Bleeding   HPI Comments: Jane Benitez is a 40 y.o. [id number] at 7373w4d who presents for contractions & bloody discharge. Was seen in office yesterday for contractions. Checked by Dr. Tenny Crawoss, cervix closed. Dr. Tenny Crawoss prescribed her procardia but pt states pharmacy didn't have the prescription so she didn't get it filled. Contractions worsened throughout the night. Now reports painful contractions every 3-5 minutes. Rates pain 9/10.  Noticed this morning some bloody mucoid discharge. Denies LOF, n/v/d, or urinary complaints. Denies hx of preterm labor/delivery. Positive fetal movement.    OB History    Gravida Para Term Preterm AB TAB SAB Ectopic Multiple Living   5 3 3  1  1   3       Past Medical History  Diagnosis Date  . Thyroid disease   . Endometriosis   . Palpitations 12/19/2015  . Obstetrical laceration, fourth degree     Past Surgical History  Procedure Laterality Date  . Appendectomy    . Vaginal and anal repair    . Cesarean section      2012 and 2013    Family History  Problem Relation Age of Onset  . Hypertension Father   . Heart disease Maternal Grandfather   . Hypertension Maternal Grandfather   . Heart disease Paternal Grandfather   . Hypertension Paternal Grandfather     Social History  Substance Use Topics  . Smoking status: Never Smoker   . Smokeless tobacco: Never Used  . Alcohol Use: No    Allergies: No Known Allergies  Prescriptions prior to admission  Medication Sig Dispense Refill Last Dose  . lansoprazole (PREVACID) 30 MG capsule Take 30 mg by mouth as needed.   Taking  . levothyroxine (SYNTHROID, LEVOTHROID) 75 MCG tablet Take 75 mcg by mouth daily before breakfast.   Taking  . Prenatal Vit w/Fe-Methylfol-FA (PNV PO) Take by mouth.   Taking     Review of Systems  Constitutional: Negative.   Gastrointestinal: Positive for abdominal pain. Negative for nausea, vomiting, diarrhea and constipation.  Genitourinary: Negative for dysuria.       + vaginal bleeding (bloody mucous No LOF   Physical Exam   Blood pressure 135/73, pulse 93, temperature 98.5 F (36.9 C), temperature source Oral, resp. rate 18, height 5\' 5"  (1.651 m), weight 210 lb (95.255 kg), last menstrual period 05/02/2015, unknown if currently breastfeeding.  Physical Exam  Nursing note and vitals reviewed. Constitutional: She is oriented to person, place, and time. She appears well-developed and well-nourished. She appears distressed.  HENT:  Head: Normocephalic and atraumatic.  Eyes: Conjunctivae are normal. Right eye exhibits no discharge. Left eye exhibits no discharge. No scleral icterus.  Neck: Normal range of motion.  Cardiovascular: Normal rate, regular rhythm and normal heart sounds.   No murmur heard. Respiratory: Effort normal and breath sounds normal. No respiratory distress. She has no wheezes.  GI: Soft. There is no tenderness.  Ctx palpate mild. Relaxed between ctx  Genitourinary:  No blood on exam  Neurological: She is alert and oriented to person, place, and time.  Skin: Skin is warm and dry. She is not diaphoretic.  Psychiatric: She has a normal mood and affect. Her behavior is normal. Judgment and thought content normal.   Dilation: 1.5 Effacement (%):  60 Cervical Position: Middle Exam by:: Estanislado Spire NP   Fetal Tracing:  Baseline: 135 Variability: moderate Accelerations: 10x10 Decelerations: variable  Toco: 3-6 mins    MAU Course  Procedures Results for orders placed or performed during the hospital encounter of 12/30/15 (from the past 24 hour(s))  Urinalysis, Routine w reflex microscopic (not at [facility])     Status: Abnormal   Collection Time: 12/30/15  8:20 AM  Result Value Ref Range   Color, Urine YELLOW YELLOW    APPearance CLEAR CLEAR   Specific Gravity, Urine <1.005 (L) 1.005 - 1.030   pH 7.0 5.0 - 8.0   Glucose, UA NEGATIVE NEGATIVE mg/dL   Hgb urine dipstick LARGE (A) NEGATIVE   Bilirubin Urine NEGATIVE NEGATIVE   Ketones, ur NEGATIVE NEGATIVE mg/dL   Protein, ur NEGATIVE NEGATIVE mg/dL   Nitrite NEGATIVE NEGATIVE   Leukocytes, UA NEGATIVE NEGATIVE  Urine microscopic-add on     Status: Abnormal   Collection Time: 12/30/15  8:20 AM  Result Value Ref Range   Squamous Epithelial / LPF 0-5 (A) NONE SEEN   WBC, UA 0-5 0 - 5 WBC/hpf   RBC / HPF 0-5 0 - 5 RBC/hpf   Bacteria, UA RARE (A) NONE SEEN   MDM BPP 8/8 with normal AFI 0844- C/w Dr. Chestine Spore. Notified of pts admission, ob history, & exam. IV fluid bolus & reassess SVE.  Ctx not resolved after 3 doses of procardia 10 mg & IV fluid bolus.  Per Dr. Chestine Spore, give Morphine for pain & BMZ SVE essentially unchanged during MAU visit. Contractions continue & pt reports not relief in pain after morphine.  Assessment and Plan  A: Preterm contractions, third trimester  P: Pt admitted to antenatal for observation per Dr. Arther Abbott 12/30/2015, 8:31 AM

## 2015-12-30 NOTE — Brief Op Note (Signed)
12/30/2015  6:29 PM  PATIENT:  Kemper DurieJessica Preiss  40 y.o. female  PRE-OPERATIVE DIAGNOSIS:  Repeat Cesarean Section for Preterm labor with bilateral tubal  POST-OPERATIVE DIAGNOSIS:  Repeat Cesarean Section for Preterm labor with bilateral tubal  PROCEDURE:  Procedure(s): CESAREAN SECTION (N/A)  SURGEON:  Surgeon(s) and Role:    * Marlow Baarsyanna Bernise Sylvain, MD - Primary  ANESTHESIA:   spinal  EBL:  Total I/O In: 2000 [I.V.:2000] Out: 700 [Urine:100; Blood:600]  BLOOD ADMINISTERED:none  DRAINS: none   LOCAL MEDICATIONS USED:  NONE  SPECIMEN:  Source of Specimen:  placenta  DISPOSITION OF SPECIMEN:  PATHOLOGY  COUNTS:  YES  TOURNIQUET:  * No tourniquets in log *  DICTATION: .Note written in EPIC  PLAN OF CARE: Admit to inpatient   PATIENT DISPOSITION:  PACU - hemodynamically stable.   Delay start of Pharmacological VTE agent (>24hrs) due to surgical blood loss or risk of bleeding: n/a

## 2015-12-30 NOTE — Progress Notes (Signed)
I spoke with Dr. Vincenza HewsQuinn, MFM, regarding patient's history and current contraction pattern.  She reviewed her history and EFM.   Over last 20 minutes, 2 ? Late decelerations.  Given history of possible uterine scar defect, she recommends moving towards delivery for any further cervical change or any additional significant  decelerations.    Will keep NPO with continuous EFM at this time.

## 2015-12-30 NOTE — Anesthesia Procedure Notes (Signed)
Spinal Patient location during procedure: OR Staffing Anesthesiologist: Licet Dunphy Performed by: anesthesiologist  Preanesthetic Checklist Completed: patient identified, site marked, surgical consent, pre-op evaluation, timeout performed, IV checked, risks and benefits discussed and monitors and equipment checked Spinal Block Patient position: sitting Prep: ChloraPrep Patient monitoring: heart rate, continuous pulse ox and blood pressure Approach: midline Location: L3-4 Injection technique: single-shot Needle Needle type: Sprotte  Needle gauge: 24 G Needle length: 9 cm Additional Notes Expiration date of kit checked and confirmed. Patient tolerated procedure well, without complications.     

## 2015-12-30 NOTE — Consult Note (Signed)
[facility]  Delivery Note:  C-section       12/30/2015  5:35 PM  I was called to the operating room at the request of the patient's obstetrician (Dr. Clark) for a repeat c-section at 34 and 4/[redacted] weeks gestation.  PRENATAL HX:  [name] at 34 and 4/[redacted] weeks gestation who was admitted in active labor.  She was scheduled for a repeat c-section at 37 weeks due to possible uterine scaring.  However, as she was actively laboring with possible uterine scaring and a thin lower uterine segment, urgent delivery was recomended.    INTRAPARTUM HX:   Repeat c-section with AROM at delivery  DELIVERY:  Infant was vigorous at delivery, requiring no resuscitation other than standard warming, drying and stimulation.  APGARs 8 and 9.  O2 saturation 100% in RA.  Exam within normal limits.  Infant will be admitted to NICU for prematurity.    _____________________ Electronically Signed By: Loran Fleet, MD Neonatologist  

## 2015-12-30 NOTE — Progress Notes (Signed)
PC from physician, MD to come and re-evaluate in approx 1 hour unless needed earlier.

## 2015-12-30 NOTE — Anesthesia Postprocedure Evaluation (Signed)
Anesthesia Post Note  Patient: Jane Benitez  Procedure(s) Performed: Procedure(s) (LRB): CESAREAN SECTION (N/A)  Patient location during evaluation: PACU Anesthesia Type: Spinal Level of consciousness: awake and alert Pain management: pain level controlled Vital Signs Assessment: post-procedure vital signs reviewed and stable Respiratory status: spontaneous breathing, nonlabored ventilation, respiratory function stable and patient connected to nasal cannula oxygen Cardiovascular status: blood pressure returned to baseline and stable Postop Assessment: no signs of nausea or vomiting Anesthetic complications: no    Last Vitals:  Filed Vitals:   12/30/15 1900 12/30/15 1915  BP: 135/73 120/79  Pulse: 67 74  Temp:    Resp: 19 19    Last Pain:  Filed Vitals:   12/30/15 1925  PainSc: 3                  Ronita Hargreaves L

## 2015-12-30 NOTE — Transfer of Care (Signed)
Immediate Anesthesia Transfer of Care Note  Patient: Jane DurieJessica Benitez  Procedure(s) Performed: Procedure(s): CESAREAN SECTION (N/A)  Patient Location: PACU  Anesthesia Type:Spinal  Level of Consciousness: awake, alert  and oriented  Airway & Oxygen Therapy: Patient Spontanous Breathing  Post-op Assessment: Report given to RN and Post -op Vital signs reviewed and stable  Post vital signs: Reviewed and stable  Last Vitals:  Filed Vitals:   12/30/15 1643 12/30/15 1649  BP:  132/75  Pulse: 99 72  Temp:    Resp:      Complications: No apparent anesthesia complications

## 2015-12-30 NOTE — MAU Note (Signed)
Patient was seen in MD office yesterday with contractions everything was fine, prescribed Procardia did not get because was not at pharmacy, now contracting every 2-3 minutes, having vaginal bleeding.

## 2015-12-30 NOTE — Anesthesia Preprocedure Evaluation (Signed)
Anesthesia Evaluation  Patient identified by MRN, date of birth, ID band Patient awake    Reviewed: Allergy & Precautions, NPO status , Patient's Chart, lab work & pertinent test results  Airway Mallampati: II  TM Distance: >3 FB Neck ROM: Full    Dental no notable dental hx.    Pulmonary neg pulmonary ROS,    Pulmonary exam normal breath sounds clear to auscultation       Cardiovascular negative cardio ROS Normal cardiovascular exam Rhythm:Regular Rate:Normal     Neuro/Psych negative neurological ROS  negative psych ROS   GI/Hepatic negative GI ROS, Neg liver ROS,   Endo/Other  Hypothyroidism   Renal/GU negative Renal ROS  negative genitourinary   Musculoskeletal negative musculoskeletal ROS (+)   Abdominal   Peds negative pediatric ROS (+)  Hematology negative hematology ROS (+)   Anesthesia Other Findings   Reproductive/Obstetrics (+) Pregnancy                             Anesthesia Physical Anesthesia Plan  ASA: II  Anesthesia Plan: Spinal   Post-op Pain Management:    Induction:   Airway Management Planned: Natural Airway  Additional Equipment:   Intra-op Plan:   Post-operative Plan:   Informed Consent: I have reviewed the patients History and Physical, chart, labs and discussed the procedure including the risks, benefits and alternatives for the proposed anesthesia with the patient or authorized representative who has indicated his/her understanding and acceptance.   Dental advisory given  Plan Discussed with: CRNA  Anesthesia Plan Comments:         Anesthesia Quick Evaluation

## 2015-12-30 NOTE — Op Note (Signed)
Cesarean Section Procedure Note  Pre-operative Diagnosis: 1. Intrauterine pregnancy at [redacted]w[redacted]d  2. Prior cesarean section x 2 3. Preterm labor  Post-operative Diagnosis: same as above  Surgeon: Marlow Baars, MD  Procedure: Repeat low transverse cesarean section with parkland bilateral tubal ligation  Anesthesia: Spinal anesthesia  Estimated Blood Loss: 600 mL         Drains: Foley catheter         Specimens: placenta to pathology         Implants: none         Complications:  None; patient tolerated the procedure well.         Disposition: PACU - hemodynamically stable.  Findings:  Normal uterus, tubes and ovaries bilaterally.  Viable female infant, 2060g (4lb 9oz) Apgars 8, 9.    Procedure Details   After spinal anesthesia was found to adequate , the patient was placed in the dorsal supine position with a leftward tilt, draped and prepped in the usual sterile manner. A Pfannenstiel incision was made and carried down through the subcutaneous tissue to the fascia.  The fascia was incised in the midline and the fascial incision was extended laterally with Mayo scissors. The superior aspect of the fascial incision was grasped with two Kocher clamp, tented up and the rectus muscles dissected off bluntly. The rectus was then dissected off with blunt dissection and Mayo scissors inferiorly. The rectus muscles were separated in the midline. The abdominal peritoneum was identified, tented up, entered sharply.  No intra-abdominal adhesions were present.  The incision was extended superiorly and inferiorly with good visualization of the bladder. The Alexis retractor was deployed.   The vesicouterine peritoneum was identified, tented up, entered sharply, and the bladder flap was created digitally. The lower uterine segment was thin but in tact.  The scalpel was then used to make a low transverse incision on the uterus which was extended laterally with blunt dissection. The fluid was clear. The fetal  vertex was identified and brought to the hysterotomy.  The head was delivered atraumatically, followed by the shoulders and body.  The cord was clamped and cut after a sixty second delay and the infant was passed to the waiting neonatologist.  Placenta was then delivered spontaneously, intact and appear normal, the uterus was cleared of all clot and debris.   The hysterotomy was repaired with #0 Monocryl in running locked fashion.  At this time, attention was turned to the left fallopian tube.  The tube was grasped with two babcock clamps.  An avascular segment of the mesosalpinx was identified, and a parkland tubal ligation was performed in the usual fashion with #0 plain gut suture.  Tubal ostia were identified on both remaining sides of the tube.  This was repeated with the right fallopian tube. The portion of the left and right fallopian tubes were sent to pathology.   The hysterotomy was reexamined and excellent hemostasis was noted.  The abdominal cavity was cleared of all clot and debris.  The Alexis retractor was removed from the abdomen. The fascia and rectus muscles were inspected and were hemostatic.  The peritoneum and then rectus muscles were closed with 2-0 vicryl in a running fashion.  The fascia was closed with 0 Vicryl in a running fashion. The subcuticular layer was irrigated and all bleeders cauterized.  The subcutaneous layer was reapproximated with interrupted 3-0 plain gut.  The skin was closed with 3-0 monocryl in a subcuticular fashion. The incision was dressed with benzoine, steri strips  and pressure dressing. All sponge lap and needle counts were correct x3. Patient tolerated the procedure well and recovered in stable condition following the procedure.

## 2015-12-30 NOTE — Progress Notes (Signed)
Patient taken by NICU to see baby on the way to room 306

## 2015-12-30 NOTE — H&P (Signed)
40 y.o. [id number] @ [redacted]w[redacted]d presents with c/o contractions and bloody discharge.  Was seen in the office yesterday w c/o ctx q30min.  Toco was quiet.  She reports contractions increased overnight to q3-5 minutes and became more painful.  Exam by MAU provider showed no vaginal bleeding.  She has received IVF, PO procardia and IV pain meds with no improvement in contractions.  Pt reports sve was closed yesterday in the office.  It is now 1-2/60 per MAU provider.  It has remained unchanged over the last few hours, but the patient continues to report painful contractions.     Pregnancy c/b: 1. C/s x 2 2. 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  She had an SIS that was performed prior to pregnancy that showed a uterine scar defect of 2.3 mm.  She was seen in consultation by MFM this pregnancy, who recommended delivery by RCS at 37 weeks.   3.  History of 4th degree laceration with G1, followed by RV fistula, requiring 2 surgical repairs.     Past Medical History  Diagnosis Date  . Thyroid disease   . Endometriosis   . Palpitations 12/19/2015  . Obstetrical laceration, fourth degree     Past Surgical History  Procedure Laterality Date  . Appendectomy    . Vaginal and anal repair    . Cesarean section      2012 and 2013    OB History  Gravida Para Term Preterm AB SAB TAB Ectopic Multiple Living  # Outcome Date GA Lbr Len/2nd Weight Sex Delivery Anes PTL Lv  5 Current           4 SAB           3 Term           2 Term           1 Term               Social History   Social History  . Marital Status: Married    Spouse Name: N/A  . Number of Children: N/A  . Years of Education: N/A   Occupational History  . Not on file.   Social History Main Topics  . Smoking status: Never Smoker   . Smokeless tobacco: Never Used  . Alcohol Use: No  . Drug Use: No  . Sexual Activity: Yes   Other Topics Concern  . Not on file   Social History Narrative   Review of patient's allergies  indicates no known allergies.    Prenatal Transfer Tool  Maternal Diabetes: No Genetic Screening: Normal Maternal Ultrasounds/Referrals: Normal Fetal Ultrasounds or other Referrals:  Referred to Materal Fetal Medicine  Maternal Substance Abuse:  No Significant Maternal Medications:  Meds include: Syntroid Significant Maternal Lab Results: Lab values include: Group B Strep negative     Filed Vitals:   12/30/15 1028 12/30/15 1051  BP: 119/73 130/62  Pulse:    Temp:    Resp:       General:  Appears painful w contractions Abdomen:  soft, gravid, contractions palpate mild SVE:  1-2/60/high per MAU provider FHTs:  130s, mod var, rare variable decel Toco:  q4-5 min   A/P   40 y.o. L2G4010 [redacted]w[redacted]d presents with threatened preterm labor Cervix stable, but continued uterine contractions and history of cesarean section x 2 Received BMZ #1 in MAU Will admit to AP for continued observation Given possible uterine defect  with thin LUS at 2.733mm prior to pregnancy and potential for increased risk of uterine rupture, will ask MFM to weight in on delivery planning in light of persistent uterine contractions.   FSR at this time, collect gbs  S[facility] Emmanuel HospitalDYANNA GEFFEL CorinneLARK

## 2015-12-31 LAB — CBC
HEMATOCRIT: 29.3 % — AB (ref 36.0–46.0)
Hemoglobin: 9.8 g/dL — ABNORMAL LOW (ref 12.0–15.0)
MCH: 28.2 pg (ref 26.0–34.0)
MCHC: 33.4 g/dL (ref 30.0–36.0)
MCV: 84.4 fL (ref 78.0–100.0)
PLATELETS: 259 10*3/uL (ref 150–400)
RBC: 3.47 MIL/uL — AB (ref 3.87–5.11)
RDW: 12.9 % (ref 11.5–15.5)
WBC: 15.9 10*3/uL — AB (ref 4.0–10.5)

## 2015-12-31 LAB — RPR: RPR Ser Ql: NONREACTIVE

## 2015-12-31 NOTE — Progress Notes (Signed)
Patient is doing well.  She is tolerating PO, ambulating.  Foley catheter removed this AM--has not yet voided.  Pain is controlled.  Lochia is appropriate  Filed Vitals:   12/30/15 2009 12/30/15 2100 12/31/15 0100 12/31/15 0524  BP: 114/52 126/70 105/56 91/77  Pulse: 58 52 58 56  Temp: 98.7 F (37.1 C) 98.2 F (36.8 C) 98.2 F (36.8 C) 97.9 F (36.6 C)  TempSrc: Oral Oral Oral Oral  Resp: 16 18 16 16   Height:      Weight:      SpO2: 97% 98% 100% 90%    NAD Abdomen:  soft, appropriate tenderness, incisions intact and without erythema or drainage ext:    Symmetric, 1+ bilaterally  Lab Results  Component Value Date   WBC 15.9* 12/31/2015   HGB 9.8* 12/31/2015   HCT 29.3* 12/31/2015   MCV 84.4 12/31/2015   PLT 259 12/31/2015    --/--/B NEG, B NEG (03/03 1418)/RImmune  A/P    39 y.o. [id number] POD 1 s/p RCS and BTL 2/2 PTL at 34 weeks Routine post op and postpartum care.   Baby Rh neg--rhogam not needed Baby doing well in NICU on RA

## 2015-12-31 NOTE — Addendum Note (Signed)
Addendum  created 12/31/15 1055 by Rica RecordsAngela Myldred Raju, CRNA   Modules edited: Clinical Notes   Clinical Notes:  File: [id number]

## 2015-12-31 NOTE — Lactation Note (Signed)
This note was copied from a baby's chart. Lactation Consultation Note  Patient Name: Jane Kemper DurieJessica Benitez [id number]'s Date: 12/31/2015 Reason for consult: Initial assessment;NICU baby;Late preterm infant;Infant < 6lbs Mom has started to pump with DEBP. Reports receiving few drops to few mls of colostrum. Baby has been to the breast 2 times for approx 5 minutes. Advised Mom to pump every 3 hours for 15-20 minutes, followed by hand expression. Reviewed normal volume parameters with Mom in the 1st few days. Storage guidelines discussed, refer to NICU booklet. Mom denies discomfort with pumping. Mom to call insurance company about DEBP for home, advised of 2 week loaner program. Encouraged to call for assist as needed.   Maternal Data Has patient been taught Hand Expression?: No (Mom reports she knows how to hand express) Does the patient have breastfeeding experience prior to this delivery?: Yes  Feeding Feeding Type: Breast Milk Length of feed: 30 min  LATCH Score/Interventions Latch: Repeated attempts needed to sustain latch, nipple held in mouth throughout feeding, stimulation needed to elicit sucking reflex. Intervention(s): Assist with latch  Audible Swallowing: A few with stimulation Intervention(s): Skin to skin  Type of Nipple: Everted at rest and after stimulation  Comfort (Breast/Nipple): Soft / non-tender     Hold (Positioning): No assistance needed to correctly position infant at breast.  LATCH Score: 8  Lactation Tools Discussed/Used Tools: Pump Breast pump type: Double-Electric Breast Pump   Consult Status Consult Status: Follow-up Date: 01/01/16 Follow-up type: In-patient    Alfred LevinsGranger, Inman Fettig Ann 12/31/2015, 2:12 PM

## 2015-12-31 NOTE — Anesthesia Postprocedure Evaluation (Signed)
Anesthesia Post Note  Patient: Jane DurieJessica Benitez  Procedure(s) Performed: Procedure(s) (LRB): CESAREAN SECTION (N/A)  Patient location during evaluation: [facility] Anesthesia Type: Spinal Level of consciousness: oriented and awake and alert Pain management: pain level controlled Vital Signs Assessment: post-procedure vital signs reviewed and stable Respiratory status: spontaneous breathing, respiratory function stable and patient connected to nasal cannula oxygen Cardiovascular status: blood pressure returned to baseline and stable Postop Assessment: no headache and no backache Anesthetic complications: no Comments: Patient states pain is a 1 now Pain goal is a 5    Last Vitals:  Filed Vitals:   12/31/15 0524 12/31/15 1006  BP: 91/77 113/62  Pulse: 56 57  Temp: 36.6 C 37.1 C  Resp: 16 18    Last Pain:  Filed Vitals:   12/31/15 1006  PainSc: 1                  Erastus Bartolomei

## 2016-01-01 NOTE — Progress Notes (Signed)
Patient is doing well.  She is tolerating PO, ambulating, voiding.  Pain is controlled.  Lochia is appropriate.  Is pumping.    Filed Vitals:   12/31/15 1429 12/31/15 1726 12/31/15 2200 01/01/16 0552  BP: 110/53 120/64 110/55 104/56  Pulse: 71 66 62 74  Temp: 98 F (36.7 C) 98.5 F (36.9 C) 98.4 F (36.9 C) 98.2 F (36.8 C)  TempSrc: Oral Oral Oral Oral  Resp: 18 18 18 18   Height:      Weight:      SpO2: 100% 100% 100% 100%    NAD Abdomen:  soft, appropriate tenderness, incisions intact and without erythema or drainage ext:    Symmetric, 1+ bilaterally  Lab Results  Component Value Date   WBC 15.9* 12/31/2015   HGB 9.8* 12/31/2015   HCT 29.3* 12/31/2015   MCV 84.4 12/31/2015   PLT 259 12/31/2015    --/--/B NEG, B NEG (03/03 1418)/RImmune  A/P    39 y.o. [id number] POD #2 s/p RCS and BTL 2/2 PTL at 34 weeks Routine post op and postpartum care.   Baby Rh neg--rhogam not needed Baby doing well in NICU on RA

## 2016-01-01 NOTE — Clinical Social Work Maternal (Signed)
CLINICAL SOCIAL WORK MATERNAL/CHILD NOTE  Patient Details  Name: Jane Benitez MRN: [id number] Date of Birth: 30-Apr-1976  Date:  01/01/2016  Clinical Social Worker Initiating Note:  Lucita Ferrara MSW, LCSW Date/ Time Initiated:  01/01/16/1430     Child's Name:  Creed   Legal Guardian:  Janett Billow and Sebastian Ache  Need for Interpreter:  None   Date of Referral:  12/30/15     Reason for Referral:  NICU admission  Referral Source:  NICU   Address:  [place],  [address]  Phone number:  [phone]   Household Members:  Minor Children, Spouse   Natural Supports (not living in the home):  Immediate Family, Extended Family   Professional Supports: None   Employment: Homemaker   Type of Work:     Education:  Geophysical data processor Resources:  Multimedia programmer   Other Resources:    None identified   Cultural/Religious Considerations Which May Impact Care:  None reported  Strengths:  Ability to meet basic needs , Pediatrician chosen , Home prepared for child    Risk Factors/Current Problems:   1. MOB presents with a history of postpartum depression, with previous prescription for Zoloft to treat symptoms.   Cognitive State:  Able to Concentrate , Alert , Linear Thinking , Goal Oriented    Mood/Affect:  Comfortable , Calm , Happy    CSW Assessment:  CSW met with MOB and FOB in order to provide support secondary to infant's admission to the NICU.  MOB and FOB presented as easily engaged and receptive to the visit. Mood and affect congruent to the setting and the situation.   MOB shared belief that she is coping as one would anticipate and expect due to the NICU admission.  MOB reported that she has three other children, and that this is her first experience with an infant in the NICU.  MOB discussed normative range of emotions as she adjusts to the change, including needing to pump for the first time, being patient with her body as she  waits for milk to come in, and the infant needing to learn how to suck.  Per MOB, she began to cry earlier in the day, and found it helpful to be able to express herself.  MOB stated that she felt well supported by her RN when she cried, and acknowledged the benefit of emotional expression. She shared that she feels hopeful that the infant's NICU admission will be brief, and reported that she intends to remain in the hospital until 3/7 in order to be near the infant.  MOB reported that she hopes the infant will be able to demonstrate ability to eat and maintain weight in order to be discharged home with her, but also stated that she has been told to not have expectations for this occur in order to reduce sadness and feelings of disappointment.  CSW continued to explore the role of hope, and the ability to balance hope, faith, and expectations.  MOB shared that she feels well supported by hospital staff, and feels comfortable and confident with the NICU staff.  MOB discussed impressions of infant's health, and denied questions or concerns about what to anticipate and expect during the infant's admission.  MOB discussed additional feelings of sadness associated with her other three children being unable to visit her.  MOB presented with awareness of the situation being temporary, and acknowledged how viewing the situation as temporary can assist her to reduce anxiety and sadness.  MOB and FOB reported that they have lived in [place] for less than 2 years. They stated that they are previously from [place], and decided to they were ready for change. MOB and FOB shared that they have enjoyed their move to [facility].  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 Per MOB, this will be her first time as a stay at home with an infant, and she discussed looking forward to her time with him.   Per MOB, she presents with a history of postpartum depression after her first and third child. MOB  did not discuss in detail her prior history, but reported that she was prescribed Zoloft for a brief period of time after her third child was born. MOB shared belief that it was helpful, and discussed intention to follow up with her OB provider as she transitions postpartum. MOB denied presence of symptoms during the pregnancy. MOB expressed feeling comfortable talking to her OB, but acknowledged that CSW is available to facilitate the conversation if needed. MOB stated that she may wait to talk to her doctor until her postpartum visit, but also acknowledged that due to her increased risk due to prior history and the infant's NICU admission, there may be benefits to talking to her prior to her discharge.  MOB denied prior participation in therapy. She shared that she is familiar with the benefits, but finds the FOB to be helpful, supportive, and a great listener. MOB also discussed challenges due to busy schedules with her other children; however she is receptive to initiating therapy if needs arise.   MOB denied questions, concerns, or needs at this time. MOB and FOB expressed appreciation for the visit and support.    CSW Plan/Description:   1. Patient/Family Education - perinatal mood and anxiety disorders 2. No Further Intervention Required/No Barriers to Discharge -- however, CSW available as needed or upon family request.    Sheilah Mins, LCSW 01/01/2016, 3:03 PM

## 2016-01-01 NOTE — Lactation Note (Signed)
This note was copied from a baby's chart. Lactation Consultation Note  Patient Name: Jane Kemper DurieJessica Benitez ZOXWR'UToday's Date: 01/01/2016 Reason for consult: Follow-up assessment;NICU baby;Infant < 6lbs;Late preterm infant   Follow up with mom as she is visiting infant in NICU. Mom is pumping with DEBP ans reports she is not getting much at this time. Enc her to continue to pump every 2-3 hours followed by hand expression. Mom report she has not been hand expressing. Mom reports infant does latch to breast for short periods of time. Enc mom to call with questions/concerns. She is not being d/c today.    Maternal Data Does the patient have breastfeeding experience prior to this delivery?: Yes  Feeding Feeding Type: Formula Length of feed: 30 min  LATCH Score/Interventions                      Lactation Tools Discussed/Used WIC Program: No Pump Review: Setup, frequency, and cleaning;Milk Storage   Consult Status Consult Status: Follow-up Date: 01/02/16 Follow-up type: In-patient    Silas FloodSharon S Samnang Shugars 01/01/2016, 10:11 AM

## 2016-01-02 ENCOUNTER — Encounter (HOSPITAL_COMMUNITY): Payer: Self-pay | Admitting: Obstetrics

## 2016-01-02 NOTE — Progress Notes (Signed)
POD#3 Pt in NICU. Will go home tomorrow.

## 2016-01-02 NOTE — Lactation Note (Signed)
This note was copied from a baby's chart. Lactation Consultation Note  Mom is pumping 50-60 mls and changed setting to standard.  She states she is going to put baby to breast soon so she didn't fully empty with last pumping.  Reviewed preterm feeding norm and reminded progress should improve as baby matures.  For now expect some inconsistency with feedings.  Mom may rent for 2 weeks at discharage tomorrow.  Patient Name: Jane Kemper DurieJessica Craigo ZOXWR'UToday's Date: 01/02/2016     Maternal Data    Feeding    LATCH Score/Interventions                      Lactation Tools Discussed/Used     Consult Status      Huston FoleyMOULDEN, Maygan Koeller S 01/02/2016, 11:46 AM

## 2016-01-03 MED ORDER — OXYCODONE HCL 5 MG PO TABS: 5.0000 mg | ORAL_TABLET | ORAL | Status: DC | PRN

## 2016-01-03 NOTE — Lactation Note (Signed)
This note was copied from a baby's chart. Lactation Consultation Note  Mom will be discharged today.  She still is unsure if she still has her pump at home.  Husband will go look for pump today.  If they don't have pump they will do a 2 week rental.  Mom has a large blister in the center of her right nipple.  Mom is using 27 flanges and suction turned high.  Recommended coconut inside the flange, comfort gels between pumping and a 30 mm flange until it is healed.   Pumping 60 mls from each breast.  Also instructed to turn pump suction down to medium.  Mom is putting baby Creed to breast and he is inconsistent with latching.  Reassured this is normal newborn behavior.  She will page if she needs a pump.     Patient Name: Jane Kemper DurieJessica Dillie RUEAV'WToday's Date: 01/03/2016     Maternal Data    Feeding Feeding Type: Breast Milk Nipple Type: Slow - flow Length of feed: 30 min  LATCH Score/Interventions                      Lactation Tools Discussed/Used     Consult Status      Huston FoleyMOULDEN, Kaylene Dawn S 01/03/2016, 11:22 AM

## 2016-01-03 NOTE — Discharge Summary (Signed)
Obstetric Discharge Summary Reason for Admission: cesarean section Prenatal Procedures: ultrasound Intrapartum Procedures: cesarean: low cervical, transverse and tubal ligation Postpartum Procedures: none Complications-Operative and Postpartum: none HEMOGLOBIN  Date Value Ref Range Status  12/31/2015 9.8* 12.0 - 15.0 g/dL Final  51/88/416601/03/2016 06.311.6 g/dL Final   HCT  Date Value Ref Range Status  12/31/2015 29.3* 36.0 - 46.0 % Final  11/04/2015 34 % Final    Physical Exam:  General: alert and cooperative Lochia: appropriate Uterine Fundus: firm Incision: healing well, no significant drainage DVT Evaluation: No evidence of DVT seen on physical exam.  Discharge Diagnoses: Term Pregnancy-delivered  Discharge Information: Date: 01/03/2016 Activity: pelvic rest Diet: routine Medications: PNV, Ibuprofen and Percocet Condition: stable Instructions: refer to practice specific booklet Discharge to: home Follow-up Information    [facility] In 2 weeks.   Specialty:  Obstetrics   Contact information:   [address] [place] [address] [phone]       Newborn Data: Live born female  Birth Weight: 4 lb 8.7 oz (2060 g) APGAR: 8, 9  Baby in NICU at time of mother's discharge.  Jane Benitez, Jane Benitez 01/03/2016, 10:33 AM

## 2016-01-03 NOTE — Progress Notes (Signed)
Pt. Is discharged in the care of husband. Downstairs per ambulatory. Spirits are good. Denies any pain or discomfort. Discharged instructions with Rx  Were given  To pt . Comphrended well Questions asked an answered. Stable. Infant to remain in NICu..Marland Kitchen

## 2016-01-04 ENCOUNTER — Ambulatory Visit: Payer: Self-pay

## 2016-01-04 NOTE — Lactation Note (Signed)
This note was copied from a baby's chart. Lactation Consultation Note  Follow up visit with parents in NICU.  Mom is pumping at the bedside.  She reports obtaining 210 mls this AM.  Baby is getting gavage fed all feeds.  Mom can put the baby to breast twice per day.  Encouraged to call with concerns/assist prn.  Patient Name: Jane Kemper DurieJessica Benitez UJWJX'BToday's Date: 01/04/2016     Maternal Data    Feeding Feeding Type: Breast Milk Length of feed: 30 min  LATCH Score/Interventions                      Lactation Tools Discussed/Used     Consult Status      [name], Jane Ingham S 01/04/2016, 2:13 PM

## 2016-01-06 ENCOUNTER — Other Ambulatory Visit (HOSPITAL_COMMUNITY): Payer: Self-pay

## 2016-01-09 ENCOUNTER — Ambulatory Visit: Payer: Self-pay

## 2016-01-09 NOTE — Lactation Note (Signed)
This note was copied from a baby's chart. Lactation Consultation Note  Patient Name: Jane Kemper DurieJessica Guarisco RUEAV'WToday's Date: 01/09/2016 Reason for consult: Follow-up assessment;Other (Comment) (Blood in breastmilk)   Follow up with mom of 10 day old NICU infant. Called to NICU by RN as mom pumped BM this morning with frank blood noted in milk. Mom is pumping every 2-3 hours. She pumps 5-9 oz from left side and about 2 oz from right side with each pumping (this is typically her norm with BF her other children).   She reports the right breast is noted to have large amount of frank blood in milk this morning x 2. She notes there is a "growth" to end of right nipple similar to a granuloma, she has called her OB to speak to them. She denies crack to nipple, pain or trauma to breast, fever or flu like symptoms, lumps to breast. She has not increased pump suction recently. She notes that this nipple did have a blister early on in her pumping course but has been healed for several days.  Mom does not want to give infant milk with blood in it. He is now 2336 w gestation. She has plenty of milk to feed infant. She has chosen to dump the milk with large amounts of blood in it. Mom is BF infant once a day, he is having some bradycardic episodes, not during BF.   Advised her to maintain pumping and to decrease suction to right nipple with pumping Call OB and have lesion to nipple assessed Mom can dump milk with frank blood if she wishes Call for assistance as needed     Maternal Data    Feeding Feeding Type: Breast Milk Nipple Type: Slow - flow Length of feed: 20 min  LATCH Score/Interventions                      Lactation Tools Discussed/Used     Consult Status Consult Status: PRN Follow-up type: Call as needed    Ed BlalockSharon S Alizeh Madril 01/09/2016, 11:27 AM

## 2016-01-10 ENCOUNTER — Ambulatory Visit: Payer: Self-pay

## 2016-01-10 NOTE — Lactation Note (Signed)
This note was copied from a baby's chart. Lactation Consultation Note  Patient Name: Jane Kemper DurieJessica Benitez WUJWJ'XToday's Date: 01/10/2016   Spoke with mom earlier who said that the area that looked like a granuloma peeled off last night and now no bleeding is noted. She is not in pain and continues to pump regularly.     Maternal Data    Feeding Feeding Type: Breast Milk Nipple Type: Slow - flow Length of feed: 30 min  LATCH Score/Interventions                      Lactation Tools Discussed/Used     Consult Status      Jane Benitez 01/10/2016, 3:40 PM

## 2016-01-10 NOTE — Lactation Note (Signed)
This note was copied from a baby's chart. Lactation Consultation Note  Patient Name: Jane Kemper DurieJessica Quirino YNWGN'FToday's Date: 01/10/2016   Spoke with dad at bedside, mom is out pumping. Per dad the bleeding has stopped at this time. Told him to have mom call LC as needed.      Maternal Data    Feeding Feeding Type: Breast Milk Nipple Type: Slow - flow Length of feed: 20 min  LATCH Score/Interventions                      Lactation Tools Discussed/Used     Consult Status      Ed BlalockSharon S Celestina Gironda 01/10/2016, 12:17 PM

## 2016-01-12 ENCOUNTER — Ambulatory Visit: Payer: Self-pay

## 2016-01-12 NOTE — Lactation Note (Signed)
This note was copied from a baby's chart. Lactation Consultation Note  Follow up visit made in NICU.  Mom reports pumping is going well and milk supply is abundant.  She is not putting Creed to breast because he becomes too fatigued.  She is pleased he is doing well ad lib with the bottle.  Mom feels she will work on breastfeeding at home.  Reviewed outpatient services and encouraged to make an appointment post discharge.  Patient Name: Jane Kemper DurieJessica Baxley HQION'GToday's Date: 01/12/2016     Maternal Data    Feeding Feeding Type: Breast Milk Nipple Type: Slow - flow Length of feed: 30 min  LATCH Score/Interventions                      Lactation Tools Discussed/Used     Consult Status      Huston FoleyMOULDEN, Hajar Penninger S 01/12/2016, 1:54 PM

## 2016-01-13 ENCOUNTER — Inpatient Hospital Stay (HOSPITAL_COMMUNITY)
Admission: RE | Admit: 2016-01-13 | Discharge: 2016-01-13 | Disposition: A | Discharge status: Home / Self Care | Payer: BLUE CROSS/BLUE SHIELD | Source: Ambulatory Visit

## 2016-01-16 ENCOUNTER — Inpatient Hospital Stay (HOSPITAL_COMMUNITY)
Admission: RE | Admit: 2016-01-16 | Payer: BLUE CROSS/BLUE SHIELD | Source: Ambulatory Visit | Admitting: Obstetrics and Gynecology

## 2016-01-16 ENCOUNTER — Encounter (HOSPITAL_COMMUNITY): Admission: RE | Payer: Self-pay | Source: Ambulatory Visit

## 2016-01-16 SURGERY — Surgical Case
Anesthesia: Regional

## 2016-02-03 ENCOUNTER — Encounter (HOSPITAL_COMMUNITY): Payer: Self-pay | Admitting: Obstetrics

## 2016-03-19 ENCOUNTER — Ambulatory Visit: Payer: BLUE CROSS/BLUE SHIELD | Admitting: Cardiovascular Disease

## 2016-08-28 ENCOUNTER — Other Ambulatory Visit: Payer: Self-pay | Admitting: Obstetrics and Gynecology

## 2016-08-29 LAB — CYTOLOGY - PAP

## 2016-11-23 ENCOUNTER — Other Ambulatory Visit: Payer: Self-pay | Admitting: Obstetrics and Gynecology

## 2016-11-23 DIAGNOSIS — N6315 Unspecified lump in the right breast, overlapping quadrants: Secondary | ICD-10-CM

## 2016-11-23 DIAGNOSIS — N6310 Unspecified lump in the right breast, unspecified quadrant: Principal | ICD-10-CM

## 2016-11-27 ENCOUNTER — Ambulatory Visit
Admission: RE | Admit: 2016-11-27 | Discharge: 2016-11-27 | Disposition: A | Discharge status: Home / Self Care | Payer: BLUE CROSS/BLUE SHIELD | Source: Ambulatory Visit | Attending: Obstetrics and Gynecology | Admitting: Obstetrics and Gynecology

## 2016-11-27 DIAGNOSIS — N6310 Unspecified lump in the right breast, unspecified quadrant: Principal | ICD-10-CM

## 2016-11-27 DIAGNOSIS — N6315 Unspecified lump in the right breast, overlapping quadrants: Secondary | ICD-10-CM

## 2017-10-16 IMAGING — US US MFM FETAL BPP W/O NON-STRESS
1 series · 12 of 12 positions shown · non-contrast
Comparison: none

[Series 1: us mfm fetal bpp w/o non-stress · 12 acquisitions, 12 frames shown]
[im 1/12]
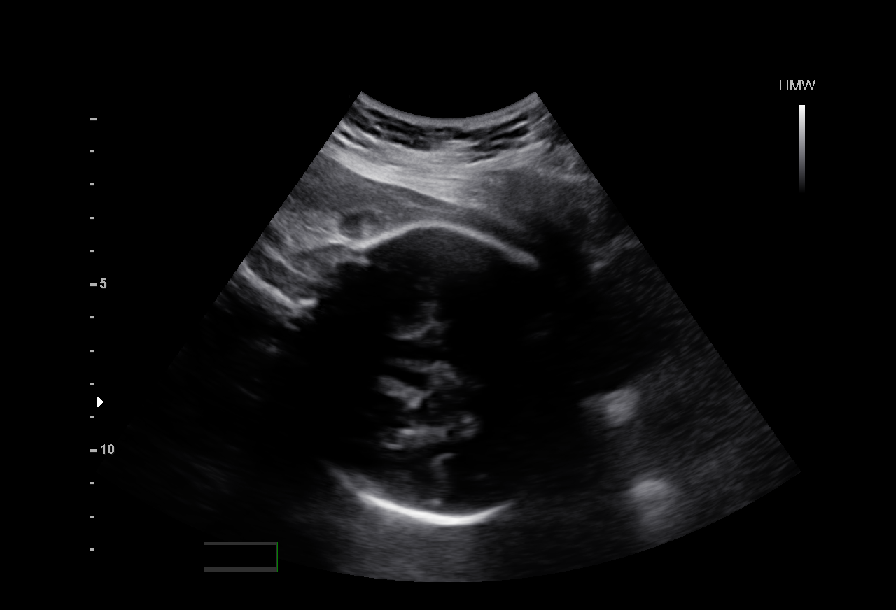
[im 2/12]
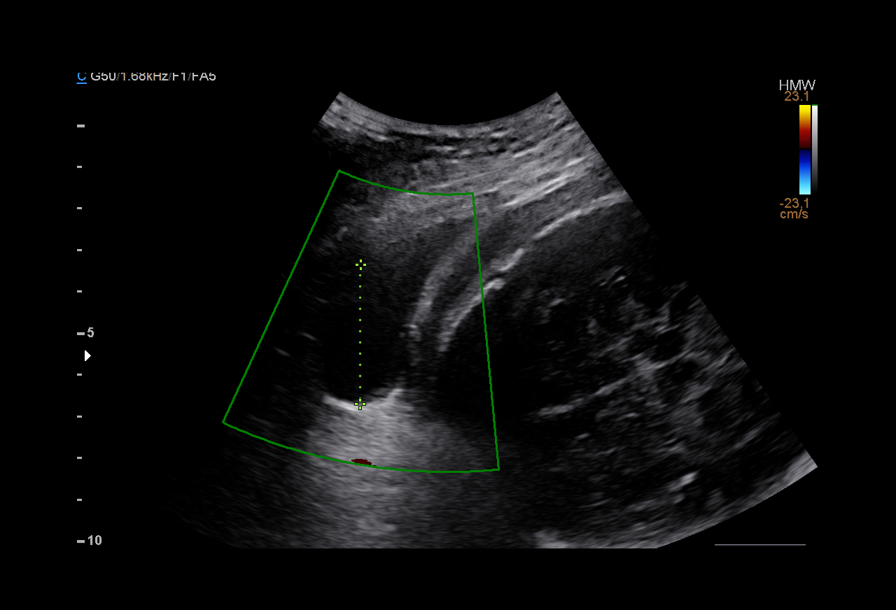
[im 3/12]
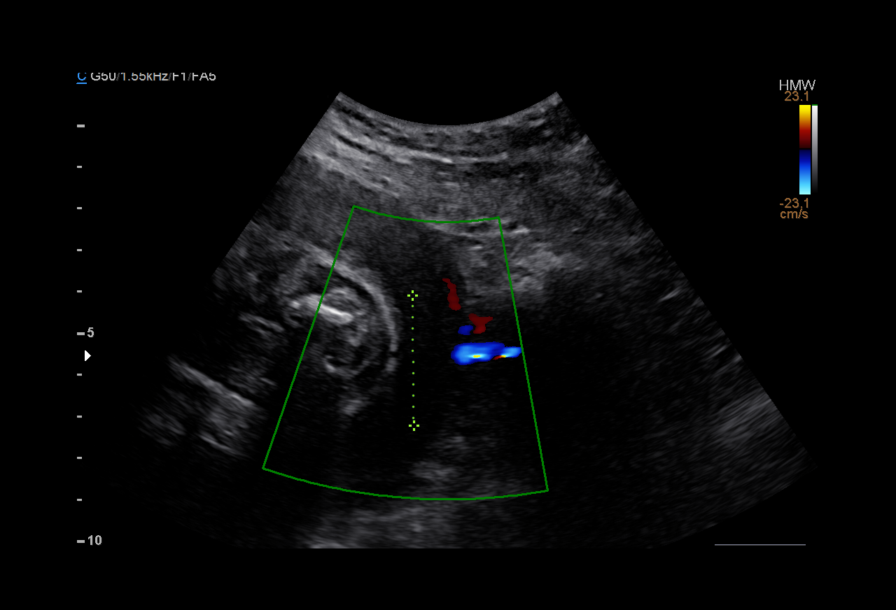
[im 4/12]
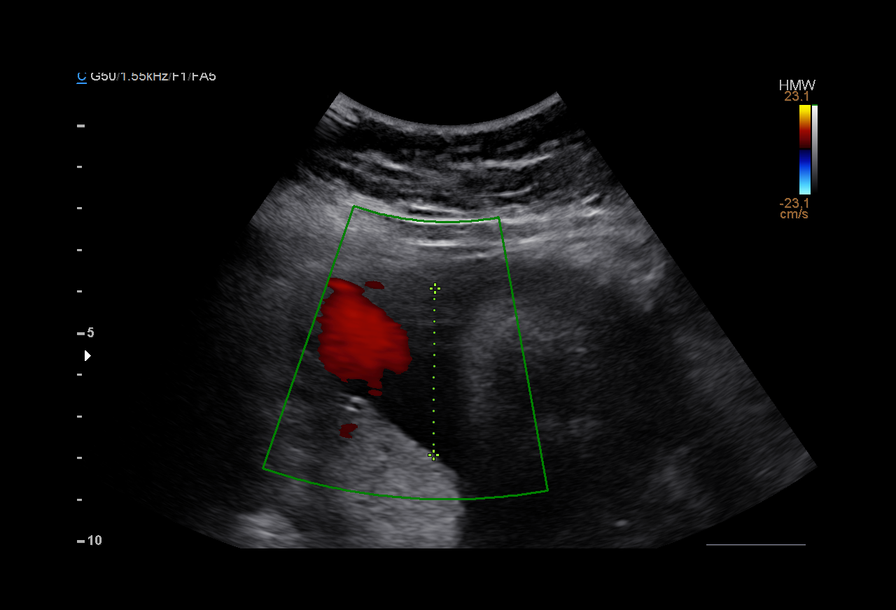
[im 5/12]
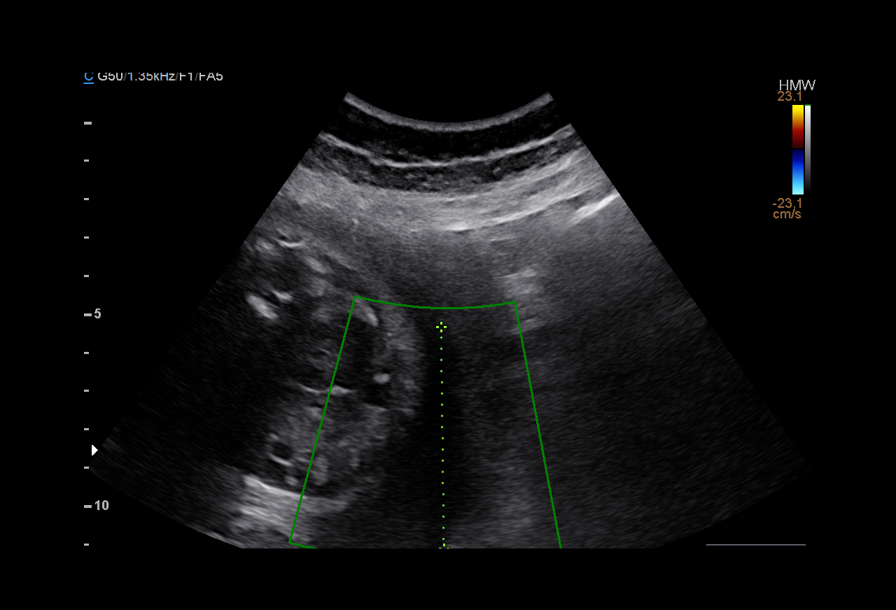
[im 6/12]
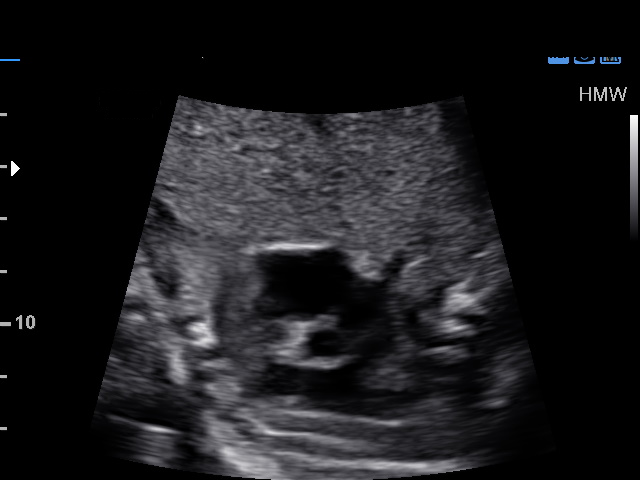
[im 7/12]
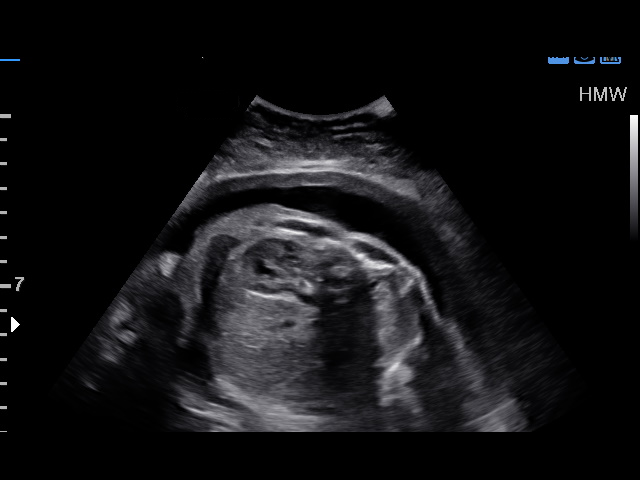
[im 8/12]
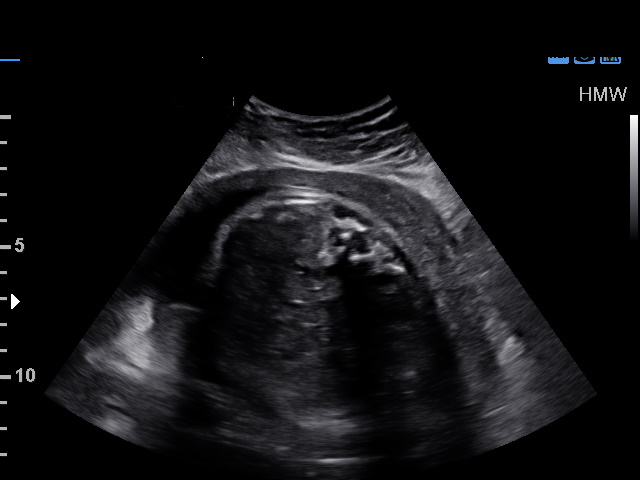
[im 9/12]
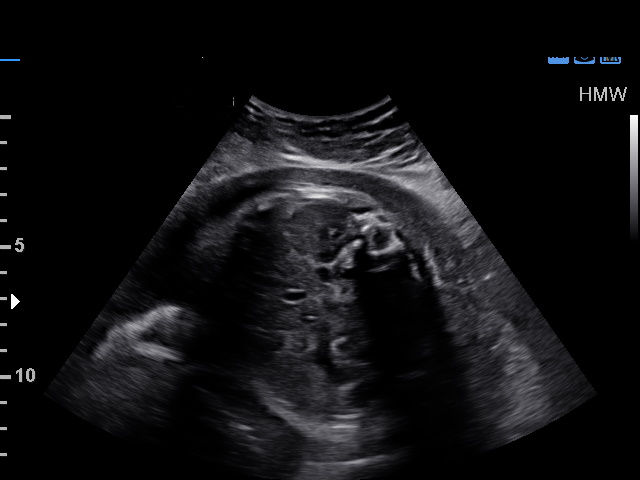
[im 10/12]
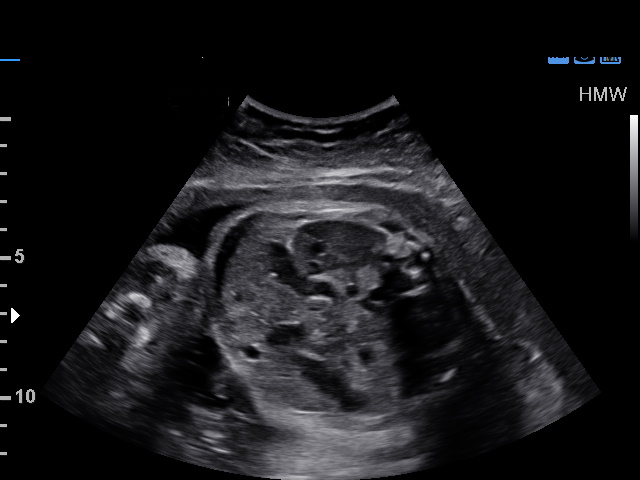
[im 11/12]
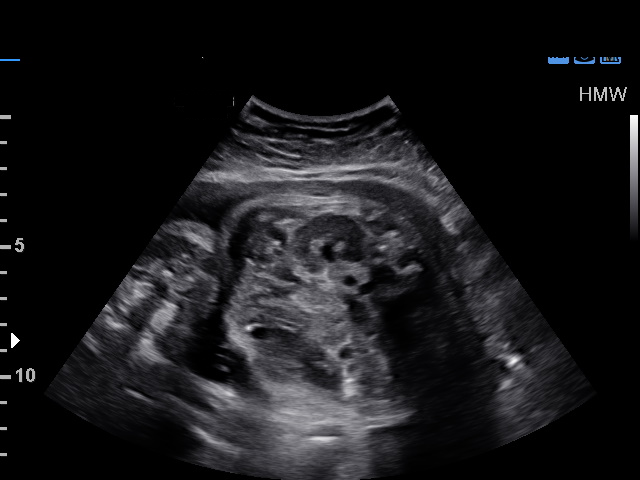
[im 12/12]
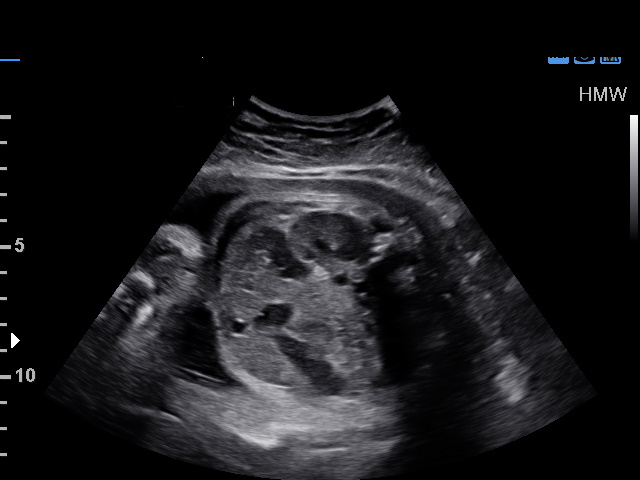

[12 of 12 positions shown; findings below may reference images not displayed]

Performed By:     Sodano Dibari          Secondary Phy.:   KAMARA POLIZZI
OBGYN
MAU/Triage

Indications

34 weeks gestation of pregnancy
Non-reactive NST, FHR decelerations
Fetal heart rate decelerations affecting       O76
management of mother
OB History

Gravidity:    5         Term:   3         SAB:   1
Living:       3
Fetal Evaluation

Num Of Fetuses:     1
Fetal Heart         140
Rate(bpm):
Cardiac Activity:   Observed
Presentation:       Cephalic

Amniotic Fluid
AFI FV:      Subjectively within normal limits
AFI Sum:     16.25   cm      59   %Tile     Larg Pckt:   5.78   cm
RUQ:   3.34   cm    RLQ:    5.78   cm    LUQ:   3.13    cm   LLQ:    4      cm
Biophysical Evaluation

Amniotic F.V:   Within normal limits       F. Tone:        Observed
F. Movement:    Observed                   Score:          [DATE]
F. Breathing:   Observed
Gestational Age

LMP:           34w 4d       Date:   05/02/15                 EDD:   02/06/16
Best:          34w 4d    Det. By:   LMP  (05/02/15)          EDD:   02/06/16
Impression

SIUP at 34+4 weeks
Cephalic presentation
Normal amniotic fluid volume
BPP [DATE]
Recommendations

Follow-up as clinically indicated

## 2021-10-27 DIAGNOSIS — Z1231 Encounter for screening mammogram for malignant neoplasm of breast: Secondary | ICD-10-CM | POA: Diagnosis not present

## 2021-11-08 DIAGNOSIS — R922 Inconclusive mammogram: Secondary | ICD-10-CM | POA: Diagnosis not present

## 2021-11-08 DIAGNOSIS — R928 Other abnormal and inconclusive findings on diagnostic imaging of breast: Secondary | ICD-10-CM | POA: Diagnosis not present

## 2021-11-08 DIAGNOSIS — R921 Mammographic calcification found on diagnostic imaging of breast: Secondary | ICD-10-CM | POA: Diagnosis not present

## 2021-11-10 DIAGNOSIS — R5383 Other fatigue: Secondary | ICD-10-CM | POA: Diagnosis not present

## 2021-11-15 ENCOUNTER — Other Ambulatory Visit: Payer: Self-pay

## 2021-11-15 DIAGNOSIS — R921 Mammographic calcification found on diagnostic imaging of breast: Secondary | ICD-10-CM | POA: Diagnosis not present

## 2021-11-15 DIAGNOSIS — N6011 Diffuse cystic mastopathy of right breast: Secondary | ICD-10-CM | POA: Diagnosis not present

## 2022-01-11 DIAGNOSIS — Z01419 Encounter for gynecological examination (general) (routine) without abnormal findings: Secondary | ICD-10-CM | POA: Diagnosis not present

## 2022-01-11 DIAGNOSIS — Z124 Encounter for screening for malignant neoplasm of cervix: Secondary | ICD-10-CM | POA: Diagnosis not present

## 2022-04-20 DIAGNOSIS — Z0000 Encounter for general adult medical examination without abnormal findings: Secondary | ICD-10-CM | POA: Diagnosis not present

## 2022-04-20 DIAGNOSIS — E039 Hypothyroidism, unspecified: Secondary | ICD-10-CM | POA: Diagnosis not present

## 2022-04-20 DIAGNOSIS — Z13220 Encounter for screening for lipoid disorders: Secondary | ICD-10-CM | POA: Diagnosis not present

## 2022-08-14 DIAGNOSIS — E039 Hypothyroidism, unspecified: Secondary | ICD-10-CM | POA: Diagnosis not present

## 2022-08-14 DIAGNOSIS — R5383 Other fatigue: Secondary | ICD-10-CM | POA: Diagnosis not present

## 2022-08-14 DIAGNOSIS — Z23 Encounter for immunization: Secondary | ICD-10-CM | POA: Diagnosis not present

## 2022-08-14 DIAGNOSIS — M2550 Pain in unspecified joint: Secondary | ICD-10-CM | POA: Diagnosis not present

## 2022-12-18 DIAGNOSIS — Z1231 Encounter for screening mammogram for malignant neoplasm of breast: Secondary | ICD-10-CM | POA: Diagnosis not present

## 2023-05-03 DIAGNOSIS — R55 Syncope and collapse: Secondary | ICD-10-CM | POA: Diagnosis not present

## 2023-05-03 DIAGNOSIS — Z0000 Encounter for general adult medical examination without abnormal findings: Secondary | ICD-10-CM | POA: Diagnosis not present

## 2023-05-03 DIAGNOSIS — E039 Hypothyroidism, unspecified: Secondary | ICD-10-CM | POA: Diagnosis not present

## 2023-05-03 DIAGNOSIS — E559 Vitamin D deficiency, unspecified: Secondary | ICD-10-CM | POA: Diagnosis not present

## 2023-05-17 ENCOUNTER — Other Ambulatory Visit: Payer: Self-pay | Admitting: Pediatrics

## 2023-05-17 ENCOUNTER — Ambulatory Visit
Admission: RE | Admit: 2023-05-17 | Discharge: 2023-05-17 | Disposition: A | Discharge status: Home / Self Care | Payer: BC Managed Care – PPO | Source: Ambulatory Visit | Attending: Pediatrics | Admitting: Pediatrics

## 2023-05-17 DIAGNOSIS — T1490XA Injury, unspecified, initial encounter: Secondary | ICD-10-CM

## 2023-05-17 DIAGNOSIS — M25571 Pain in right ankle and joints of right foot: Secondary | ICD-10-CM | POA: Diagnosis not present

## 2023-08-09 DIAGNOSIS — F331 Major depressive disorder, recurrent, moderate: Secondary | ICD-10-CM | POA: Diagnosis not present

## 2023-08-23 DIAGNOSIS — F321 Major depressive disorder, single episode, moderate: Secondary | ICD-10-CM | POA: Diagnosis not present

## 2023-09-05 DIAGNOSIS — F339 Major depressive disorder, recurrent, unspecified: Secondary | ICD-10-CM | POA: Diagnosis not present

## 2023-09-11 DIAGNOSIS — F321 Major depressive disorder, single episode, moderate: Secondary | ICD-10-CM | POA: Diagnosis not present

## 2023-09-25 DIAGNOSIS — F321 Major depressive disorder, single episode, moderate: Secondary | ICD-10-CM | POA: Diagnosis not present

## 2023-10-04 DIAGNOSIS — F321 Major depressive disorder, single episode, moderate: Secondary | ICD-10-CM | POA: Diagnosis not present

## 2023-10-08 DIAGNOSIS — F321 Major depressive disorder, single episode, moderate: Secondary | ICD-10-CM | POA: Diagnosis not present

## 2023-10-11 DIAGNOSIS — F419 Anxiety disorder, unspecified: Secondary | ICD-10-CM | POA: Diagnosis not present

## 2023-10-11 DIAGNOSIS — F329 Major depressive disorder, single episode, unspecified: Secondary | ICD-10-CM | POA: Diagnosis not present

## 2023-10-16 DIAGNOSIS — F321 Major depressive disorder, single episode, moderate: Secondary | ICD-10-CM | POA: Diagnosis not present

## 2023-11-01 DIAGNOSIS — F321 Major depressive disorder, single episode, moderate: Secondary | ICD-10-CM | POA: Diagnosis not present

## 2023-11-06 DIAGNOSIS — F321 Major depressive disorder, single episode, moderate: Secondary | ICD-10-CM | POA: Diagnosis not present

## 2023-11-12 DIAGNOSIS — F321 Major depressive disorder, single episode, moderate: Secondary | ICD-10-CM | POA: Diagnosis not present

## 2023-11-25 DIAGNOSIS — F321 Major depressive disorder, single episode, moderate: Secondary | ICD-10-CM | POA: Diagnosis not present

## 2023-11-26 DIAGNOSIS — F321 Major depressive disorder, single episode, moderate: Secondary | ICD-10-CM | POA: Diagnosis not present

## 2023-12-03 DIAGNOSIS — F321 Major depressive disorder, single episode, moderate: Secondary | ICD-10-CM | POA: Diagnosis not present

## 2023-12-04 DIAGNOSIS — F321 Major depressive disorder, single episode, moderate: Secondary | ICD-10-CM | POA: Diagnosis not present

## 2023-12-09 DIAGNOSIS — F321 Major depressive disorder, single episode, moderate: Secondary | ICD-10-CM | POA: Diagnosis not present

## 2023-12-17 DIAGNOSIS — F321 Major depressive disorder, single episode, moderate: Secondary | ICD-10-CM | POA: Diagnosis not present

## 2023-12-23 DIAGNOSIS — F321 Major depressive disorder, single episode, moderate: Secondary | ICD-10-CM | POA: Diagnosis not present

## 2023-12-31 DIAGNOSIS — Z1231 Encounter for screening mammogram for malignant neoplasm of breast: Secondary | ICD-10-CM | POA: Diagnosis not present

## 2023-12-31 DIAGNOSIS — F321 Major depressive disorder, single episode, moderate: Secondary | ICD-10-CM | POA: Diagnosis not present

## 2024-01-10 DIAGNOSIS — R922 Inconclusive mammogram: Secondary | ICD-10-CM | POA: Diagnosis not present

## 2024-01-10 DIAGNOSIS — F321 Major depressive disorder, single episode, moderate: Secondary | ICD-10-CM | POA: Diagnosis not present

## 2024-01-14 DIAGNOSIS — F321 Major depressive disorder, single episode, moderate: Secondary | ICD-10-CM | POA: Diagnosis not present

## 2024-01-21 DIAGNOSIS — F321 Major depressive disorder, single episode, moderate: Secondary | ICD-10-CM | POA: Diagnosis not present

## 2024-01-28 DIAGNOSIS — F321 Major depressive disorder, single episode, moderate: Secondary | ICD-10-CM | POA: Diagnosis not present

## 2024-02-04 DIAGNOSIS — F419 Anxiety disorder, unspecified: Secondary | ICD-10-CM | POA: Diagnosis not present

## 2024-02-04 DIAGNOSIS — E039 Hypothyroidism, unspecified: Secondary | ICD-10-CM | POA: Diagnosis not present

## 2024-02-04 DIAGNOSIS — F339 Major depressive disorder, recurrent, unspecified: Secondary | ICD-10-CM | POA: Diagnosis not present

## 2024-02-07 DIAGNOSIS — F321 Major depressive disorder, single episode, moderate: Secondary | ICD-10-CM | POA: Diagnosis not present

## 2024-02-14 DIAGNOSIS — F321 Major depressive disorder, single episode, moderate: Secondary | ICD-10-CM | POA: Diagnosis not present

## 2024-02-18 DIAGNOSIS — F321 Major depressive disorder, single episode, moderate: Secondary | ICD-10-CM | POA: Diagnosis not present

## 2024-02-25 DIAGNOSIS — F321 Major depressive disorder, single episode, moderate: Secondary | ICD-10-CM | POA: Diagnosis not present

## 2024-02-27 DIAGNOSIS — R5382 Chronic fatigue, unspecified: Secondary | ICD-10-CM | POA: Diagnosis not present

## 2024-02-27 DIAGNOSIS — E559 Vitamin D deficiency, unspecified: Secondary | ICD-10-CM | POA: Diagnosis not present

## 2024-03-03 DIAGNOSIS — F321 Major depressive disorder, single episode, moderate: Secondary | ICD-10-CM | POA: Diagnosis not present

## 2024-03-13 DIAGNOSIS — F321 Major depressive disorder, single episode, moderate: Secondary | ICD-10-CM | POA: Diagnosis not present

## 2024-03-27 DIAGNOSIS — F321 Major depressive disorder, single episode, moderate: Secondary | ICD-10-CM | POA: Diagnosis not present

## 2024-03-31 DIAGNOSIS — F321 Major depressive disorder, single episode, moderate: Secondary | ICD-10-CM | POA: Diagnosis not present

## 2024-04-10 DIAGNOSIS — F321 Major depressive disorder, single episode, moderate: Secondary | ICD-10-CM | POA: Diagnosis not present

## 2024-04-13 DIAGNOSIS — F321 Major depressive disorder, single episode, moderate: Secondary | ICD-10-CM | POA: Diagnosis not present

## 2024-04-28 DIAGNOSIS — F321 Major depressive disorder, single episode, moderate: Secondary | ICD-10-CM | POA: Diagnosis not present

## 2024-05-08 DIAGNOSIS — Z1159 Encounter for screening for other viral diseases: Secondary | ICD-10-CM | POA: Diagnosis not present

## 2024-05-08 DIAGNOSIS — Z13220 Encounter for screening for lipoid disorders: Secondary | ICD-10-CM | POA: Diagnosis not present

## 2024-05-08 DIAGNOSIS — Z0000 Encounter for general adult medical examination without abnormal findings: Secondary | ICD-10-CM | POA: Diagnosis not present

## 2024-05-08 DIAGNOSIS — E039 Hypothyroidism, unspecified: Secondary | ICD-10-CM | POA: Diagnosis not present

## 2024-05-12 DIAGNOSIS — I788 Other diseases of capillaries: Secondary | ICD-10-CM | POA: Diagnosis not present

## 2024-05-12 DIAGNOSIS — L538 Other specified erythematous conditions: Secondary | ICD-10-CM | POA: Diagnosis not present

## 2024-05-12 DIAGNOSIS — L814 Other melanin hyperpigmentation: Secondary | ICD-10-CM | POA: Diagnosis not present

## 2024-05-12 DIAGNOSIS — D492 Neoplasm of unspecified behavior of bone, soft tissue, and skin: Secondary | ICD-10-CM | POA: Diagnosis not present

## 2024-05-12 DIAGNOSIS — L905 Scar conditions and fibrosis of skin: Secondary | ICD-10-CM | POA: Diagnosis not present

## 2024-05-12 DIAGNOSIS — D225 Melanocytic nevi of trunk: Secondary | ICD-10-CM | POA: Diagnosis not present

## 2024-06-01 DIAGNOSIS — Z01419 Encounter for gynecological examination (general) (routine) without abnormal findings: Secondary | ICD-10-CM | POA: Diagnosis not present

## 2024-06-04 DIAGNOSIS — Z1211 Encounter for screening for malignant neoplasm of colon: Secondary | ICD-10-CM | POA: Diagnosis not present

## 2024-06-18 DIAGNOSIS — Z8041 Family history of malignant neoplasm of ovary: Secondary | ICD-10-CM | POA: Diagnosis not present

## 2024-06-18 DIAGNOSIS — N946 Dysmenorrhea, unspecified: Secondary | ICD-10-CM | POA: Diagnosis not present

## 2024-07-10 DIAGNOSIS — F321 Major depressive disorder, single episode, moderate: Secondary | ICD-10-CM | POA: Diagnosis not present

## 2024-07-14 DIAGNOSIS — F321 Major depressive disorder, single episode, moderate: Secondary | ICD-10-CM | POA: Diagnosis not present

## 2024-07-24 DIAGNOSIS — F321 Major depressive disorder, single episode, moderate: Secondary | ICD-10-CM | POA: Diagnosis not present

## 2024-07-29 DIAGNOSIS — F321 Major depressive disorder, single episode, moderate: Secondary | ICD-10-CM | POA: Diagnosis not present

## 2024-08-10 DIAGNOSIS — F321 Major depressive disorder, single episode, moderate: Secondary | ICD-10-CM | POA: Diagnosis not present

## 2024-08-13 DIAGNOSIS — L814 Other melanin hyperpigmentation: Secondary | ICD-10-CM | POA: Diagnosis not present

## 2024-08-13 DIAGNOSIS — L821 Other seborrheic keratosis: Secondary | ICD-10-CM | POA: Diagnosis not present

## 2024-08-13 DIAGNOSIS — L720 Epidermal cyst: Secondary | ICD-10-CM | POA: Diagnosis not present

## 2024-08-13 DIAGNOSIS — D1801 Hemangioma of skin and subcutaneous tissue: Secondary | ICD-10-CM | POA: Diagnosis not present

## 2024-08-18 DIAGNOSIS — F321 Major depressive disorder, single episode, moderate: Secondary | ICD-10-CM | POA: Diagnosis not present

## 2024-08-26 DIAGNOSIS — F321 Major depressive disorder, single episode, moderate: Secondary | ICD-10-CM | POA: Diagnosis not present

## 2024-09-15 DIAGNOSIS — F321 Major depressive disorder, single episode, moderate: Secondary | ICD-10-CM | POA: Diagnosis not present

## 2024-10-02 DIAGNOSIS — F321 Major depressive disorder, single episode, moderate: Secondary | ICD-10-CM | POA: Diagnosis not present

## 2024-10-05 DIAGNOSIS — F321 Major depressive disorder, single episode, moderate: Secondary | ICD-10-CM | POA: Diagnosis not present

## 2024-10-16 DIAGNOSIS — F321 Major depressive disorder, single episode, moderate: Secondary | ICD-10-CM | POA: Diagnosis not present

## 2024-12-04 ENCOUNTER — Ambulatory Visit (HOSPITAL_BASED_OUTPATIENT_CLINIC_OR_DEPARTMENT_OTHER): Admitting: Cardiology

## 2024-12-04 ENCOUNTER — Encounter (HOSPITAL_BASED_OUTPATIENT_CLINIC_OR_DEPARTMENT_OTHER): Payer: Self-pay | Admitting: Cardiology

## 2024-12-04 VITALS — Ht 65.0 in | Wt 150.4 lb

## 2024-12-04 DIAGNOSIS — R011 Cardiac murmur, unspecified: Secondary | ICD-10-CM

## 2024-12-04 DIAGNOSIS — Z7189 Other specified counseling: Secondary | ICD-10-CM

## 2024-12-04 DIAGNOSIS — R002 Palpitations: Secondary | ICD-10-CM

## 2024-12-04 NOTE — Progress Notes (Incomplete)
 " Cardiology Office Note:  .   Date:  12/04/2024  ID:  Jane Benitez, DOB Dec 29, 1975, MRN [id number] PCP: Rolinda Millman, MD  [facility] Providers Cardiologist:  None {  History of Present Illness: .   Jane Benitez is a 49 y.o. female with PMH hypothyroidism, depression seen as a new patient consultation for palpitations at the request of Dr. Rolinda.  Referral from 11/17/24 reviewed. Visit from 11/13/24 with Dr. Rolinda. Patient noted she had discontinued sertaline due to palpitations. However, stopping the sertraline did not improve the palpitations. ECG done at the visit was unremarkable. She was started on escitalopram at that visit. Also noted to have a soft heart murmur.  Has always felt that she skips a beat, noted this during pregnancy as well. Palpitations feel like a squeezing in her heart followed by a rapid heartbeat. Started increasing in frequency around 08/2024 and increasing in frequency. Can happen a few times/day and are brief, and other times it happens many times during the day and last for about 12 hours at the longest. No clear triggers or recent changes. They can disrupt sleep. No associated chest pain, no syncope recently. Did have frequent syncope as a young adult. Notes that she had similar symptoms in the past while pregnant, saw Dr. Raford for this in 2017. She was ordered a monitor but does not appear this was completed.  Reviewed potential risk factors: -Possible medication interactions: not changed with stopping sertraline or starting escitalopram -Caffeine: 2 cups of tea/day -Alcohol: very rare -Tobacco: never -OTC supplements: only as listed -Comorbidities: hypothyroidism has been stable -Exercise level: works out 6 days/week, [employer]. Also walks/lifts weights at home when she can't get to the gym. Has occasional times when she feels lightheaded when she works out. Those episode were more frequent when the palpitations became more  frequent -Labs: TSH, kidney function/electrolytes, CBC reviewed and unremarkable. Tchol 175, HDL 71, LDL 91, TG 71 -Cardiac ROS: no chest pain, no shortness of breath, no PND, no orthopnea, no LE edema. -Family history:  mother's and father's side have CAD, but both parents with high cholesterol, high blood pressure. Sister with high cholesterol. Grandfather with AS.  Has had stressful events over around the same period of time. ROS also positive for intermittent night sweats, not clearly chronologically linked to palpitations  ROS: ROS otherwise negative except as noted.   Studies Reviewed: SABRA    EKG:  EKG Interpretation Date/Time:  Friday December 04 2024 14:36:19 EST Ventricular Rate:  60 PR Interval:  144 QRS Duration:  86 QT Interval:  424 QTC Calculation: 424 R Axis:   74  Text Interpretation: Normal sinus rhythm Normal ECG Confirmed by Lonni Slain [phone]) on 12/04/2024 3:03:25 PM    Physical Exam:   VS:  Ht 5' 5 (1.651 m)   Wt 150 lb 6.4 oz (68.2 kg)   BMI 25.03 kg/m    Wt Readings from Last 3 Encounters:  12/04/24 150 lb 6.4 oz (68.2 kg)  12/30/15 210 lb (95.3 kg)  12/19/15 210 lb (95.3 kg)    GEN: Well nourished, well developed in no acute distress HEENT: Normal, moist mucous membranes NECK: No JVD CARDIAC: regular rhythm, normal S1 and S2, no rubs or gallops. 1/6 systolic murmur RUSB. VASCULAR: Radial and DP pulses 2+ bilaterally. No carotid bruits RESPIRATORY:  Clear to auscultation without rales, wheezing or rhonchi  ABDOMEN: Soft, non-tender, non-distended MUSCULOSKELETAL:  Ambulates independently SKIN: Warm and dry, no edema NEUROLOGIC:  Alert and  oriented x 3. No focal neuro deficits noted. PSYCHIATRIC:  Normal affect    ASSESSMENT AND PLAN: .    Palpitations  Murmur -discussed echocardiogram, she would like to proceed with this for peace of mind  CV risk counseling and prevention -recommend heart healthy/Mediterranean diet, with whole  grains, fruits, vegetable, fish, lean meats, nuts, and olive oil. Limit salt. -recommend moderate walking, 3-5 times/week for 30-50 minutes each session. Aim for at least 150 minutes/week. Goal should be pace of 3 miles/hours, or walking 1.5 miles in 30 minutes -recommend avoidance of tobacco products. Avoid excess alcohol.  Dispo: ***  Signed, Shelda Bruckner, MD   Shelda Bruckner, MD, PhD, [facility]  [facility]  [place]  [facility] at [facility] at [facility] [address] [place], [place] [address] [phone]   "

## 2024-12-04 NOTE — Patient Instructions (Signed)
 Medication Instructions:  Your physician recommends that you continue on your current medications as directed. Please refer to the Current Medication list given to you today.   Labwork: NONE   Testing/Procedures: Your physician has requested that you have an echocardiogram. Echocardiography is a painless test that uses sound waves to create images of your heart. It provides your doctor with information about the size and shape of your heart and how well your hearts chambers and valves are working. This procedure takes approximately one hour. There are no restrictions for this procedure. Please do NOT wear cologne, perfume, aftershave, or lotions (deodorant is allowed). Please arrive 15 minutes prior to your appointment time.  Please note: We ask at that you not bring children with you during ultrasound (echo/ vascular) testing. Due to room size and safety concerns, children are not allowed in the ultrasound rooms during exams. Our front office staff cannot provide observation of children in our lobby area while testing is being conducted. An adult accompanying a patient to their appointment will only be allowed in the ultrasound room at the discretion of the ultrasound technician under special circumstances. We apologize for any inconvenience.  2 WEEK MONITOR, THIS WILL BE MAILED TO YOU   Follow-Up: AS NEEDED   Any Other Special Instructions Will Be Listed Below (If Applicable).  ZIO XT- Long Term Monitor Instructions  Your physician has requested you wear a ZIO patch monitor for 14 days.  This is a single patch monitor. [employer] supplies one patch monitor per enrollment. Additional stickers are not available. Please do not apply patch if you will be having a Nuclear Stress Test,  Echocardiogram, Cardiac CT, MRI, or Chest Xray during the period you would be wearing the  monitor. The patch cannot be worn during these tests. You cannot remove and re-apply the  ZIO XT patch monitor.  Your  ZIO patch monitor will be mailed 3 day USPS to your address on file. It may take 3-5 days  to receive your monitor after you have been enrolled.  Once you have received your monitor, please review the enclosed instructions. Your monitor  has already been registered assigning a specific monitor serial # to you.  Billing and Patient Assistance Program Information  We have supplied [employer] with any of your insurance information on file for billing purposes. [employer] offers a sliding scale Patient Assistance Program for patients that do not have  insurance, or whose insurance does not completely cover the cost of the ZIO monitor.  You must apply for the Patient Assistance Program to qualify for this discounted rate.  To apply, please call [employer] at [phone], select option 4, select option 2, ask to apply for  Patient Assistance Program. [employer] will ask your household income, and how many people  are in your household. They will quote your out-of-pocket cost based on that information.  [employer] will also be able to set up a 27-month, interest-free payment plan if needed.  Applying the monitor   Shave hair from upper left chest.  Hold abrader disc by orange tab. Rub abrader in 40 strokes over the upper left chest as  indicated in your monitor instructions.  Clean area with 4 enclosed alcohol pads. Let dry.  Apply patch as indicated in monitor instructions. Patch will be placed under collarbone on left  side of chest with arrow pointing upward.  Rub patch adhesive wings for 2 minutes. Remove white label marked 1. Remove the white  label marked 2. Rub patch adhesive wings  for 2 additional minutes.  While looking in a mirror, press and release button in center of patch. A small green light will  flash 3-4 times. This will be your only indicator that the monitor has been turned on.  Do not shower for the first 24 hours. You may shower after the first 24 hours.  Press the button if you feel  a symptom. You will hear a small click. Record Date, Time and  Symptom in the Patient Logbook.  When you are ready to remove the patch, follow instructions on the last 2 pages of Patient  Logbook. Stick patch monitor onto the last page of Patient Logbook.  Place Patient Logbook in the blue and white box. Use locking tab on box and tape box closed  securely. The blue and white box has prepaid postage on it. Please place it in the mailbox as  soon as possible. Your physician should have your test results approximately 7 days after the  monitor has been mailed back to [facility]  Call [facility] Customer Care at [phone] if you have questions regarding  your ZIO XT patch monitor. Call them immediately if you see an orange light blinking on your  monitor.  If your monitor falls off in less than 4 days, contact our Monitor department at [phone].  If your monitor becomes loose or falls off after 4 days call [employer] at [phone] for  suggestions on securing your monitor

## 2025-01-05 ENCOUNTER — Other Ambulatory Visit (HOSPITAL_BASED_OUTPATIENT_CLINIC_OR_DEPARTMENT_OTHER)
